# Patient Record
Sex: Male | Born: 1940 | Race: White | Hispanic: No | Marital: Married | Smoking: Former smoker
Health system: Southern US, Community
[De-identification: ages and names within clinical notes are randomized; demographics above are authoritative.]

## PROBLEM LIST (undated history)

## (undated) DIAGNOSIS — M19042 Primary osteoarthritis, left hand: Secondary | ICD-10-CM

## (undated) DIAGNOSIS — M5030 Other cervical disc degeneration, unspecified cervical region: Secondary | ICD-10-CM

## (undated) HISTORY — DX: Other cervical disc degeneration, unspecified cervical region: M50.30

## (undated) HISTORY — DX: Primary osteoarthritis, left hand: M19.042

---

## 2020-04-28 ENCOUNTER — Other Ambulatory Visit: Payer: Self-pay | Admitting: Unknown Physician Specialty

## 2020-04-28 DIAGNOSIS — H9122 Sudden idiopathic hearing loss, left ear: Secondary | ICD-10-CM

## 2020-05-08 ENCOUNTER — Ambulatory Visit: Payer: Medicare HMO

## 2020-05-23 LAB — COLOGUARD: COLOGUARD: NEGATIVE

## 2020-05-26 ENCOUNTER — Ambulatory Visit: Admission: RE | Admit: 2020-05-26 | Payer: Medicare HMO | Source: Ambulatory Visit

## 2020-05-30 ENCOUNTER — Ambulatory Visit
Admission: RE | Admit: 2020-05-30 | Discharge: 2020-05-30 | Disposition: A | Discharge status: Home / Self Care | Payer: Medicare HMO | Source: Ambulatory Visit | Attending: Unknown Physician Specialty | Admitting: Unknown Physician Specialty

## 2020-05-30 ENCOUNTER — Other Ambulatory Visit: Payer: Self-pay

## 2020-05-30 DIAGNOSIS — H9122 Sudden idiopathic hearing loss, left ear: Secondary | ICD-10-CM | POA: Diagnosis present

## 2020-05-30 MED ORDER — GADOBUTROL 1 MMOL/ML IV SOLN
7.5000 mL | Freq: Once | INTRAVENOUS | Status: AC | PRN
  Administered 2020-05-30: 7.5 mL via INTRAVENOUS

## 2021-07-23 ENCOUNTER — Other Ambulatory Visit
Admission: RE | Admit: 2021-07-23 | Discharge: 2021-07-23 | Disposition: A | Discharge status: Home / Self Care | Payer: Medicare HMO | Source: Ambulatory Visit | Attending: Specialist | Admitting: Specialist

## 2021-07-23 DIAGNOSIS — R0602 Shortness of breath: Secondary | ICD-10-CM | POA: Insufficient documentation

## 2021-07-23 LAB — D-DIMER, QUANTITATIVE: D-Dimer, Quant: 3.25 ug/mL-FEU — ABNORMAL HIGH (ref 0.00–0.50)

## 2021-07-24 ENCOUNTER — Other Ambulatory Visit: Payer: Self-pay | Admitting: Specialist

## 2021-07-24 ENCOUNTER — Ambulatory Visit
Admission: RE | Admit: 2021-07-24 | Discharge: 2021-07-24 | Disposition: A | Discharge status: Home / Self Care | Payer: Medicare HMO | Source: Ambulatory Visit | Attending: Specialist | Admitting: Specialist

## 2021-07-24 ENCOUNTER — Other Ambulatory Visit: Payer: Self-pay

## 2021-07-24 DIAGNOSIS — R0602 Shortness of breath: Secondary | ICD-10-CM | POA: Diagnosis present

## 2021-07-24 DIAGNOSIS — R7989 Other specified abnormal findings of blood chemistry: Secondary | ICD-10-CM

## 2021-07-24 LAB — POCT I-STAT CREATININE: Creatinine, Ser: 1.2 mg/dL (ref 0.61–1.24)

## 2021-07-24 IMAGING — CT CT ANGIO CHEST
2 of 7 series · 18 of 46 positions shown · IV contrast (APPLIED)
Comparison: Chest radiograph dated [DATE]

CLINICAL DATA: Shortness of breath for 2 weeks.

EXAM:
CT ANGIOGRAPHY CHEST WITH CONTRAST
TECHNIQUE: Multidetector CT imaging of the chest was performed using the
standard protocol during bolus administration of intravenous
contrast. Multiplanar CT image reconstructions and MIPs were
obtained to evaluate the vascular anatomy.

[Series 5: thins · axial · 0.76mm/px · z∈[+105,+352]mm · 15 of 344 slices shown]
[im 18/344  lung]
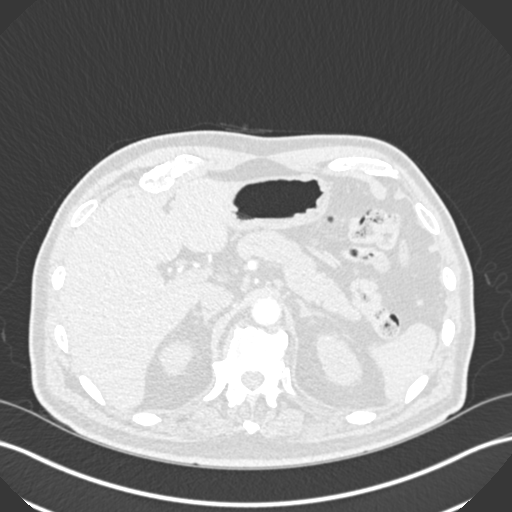
[im 35/344  soft-tissue]
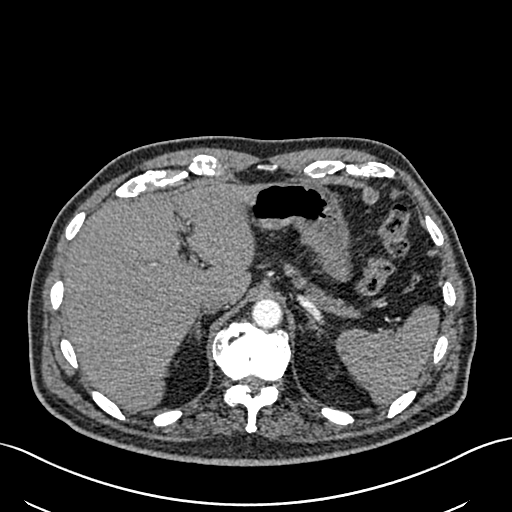
[im 69/344  lung]
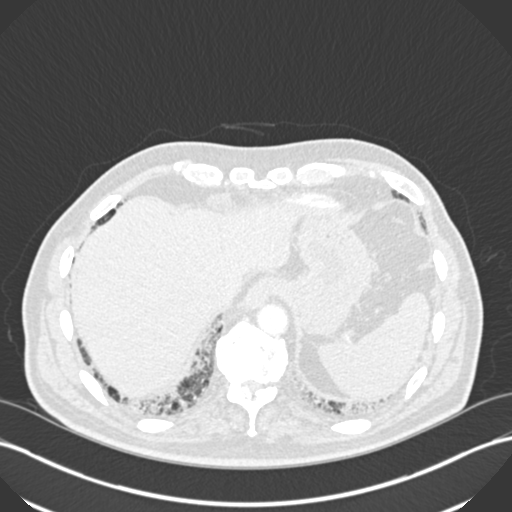
[im 86/344  soft-tissue]
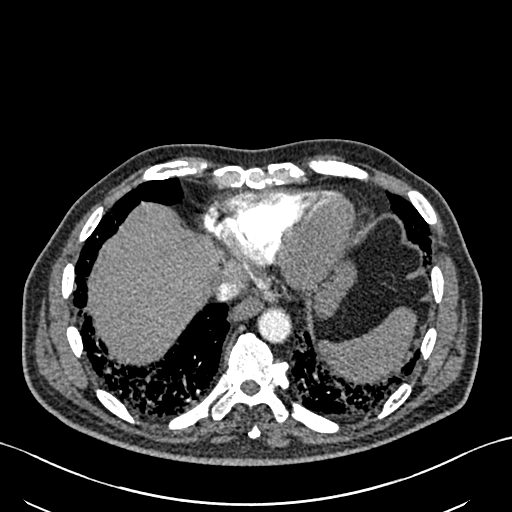
[im 103/344  lung]
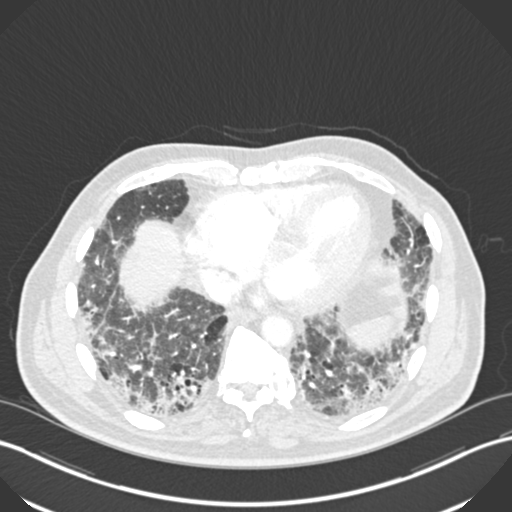
[im 121/344  soft-tissue]
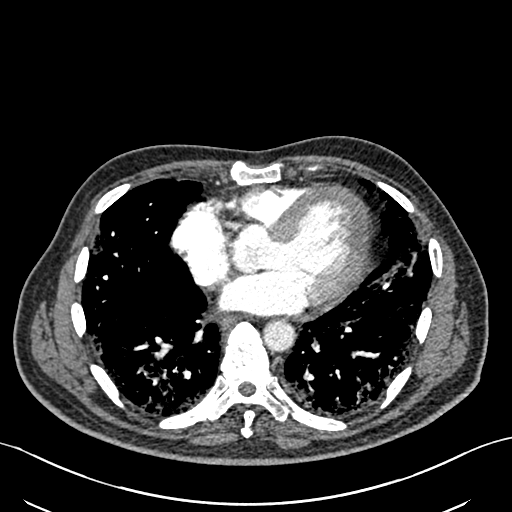
[im 155/344  lung]
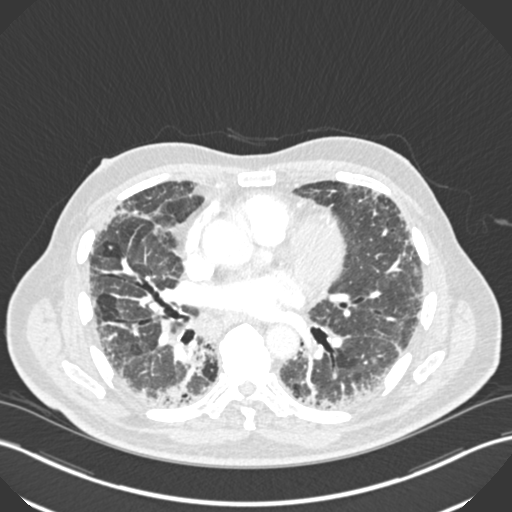
[im 172/344  soft-tissue]
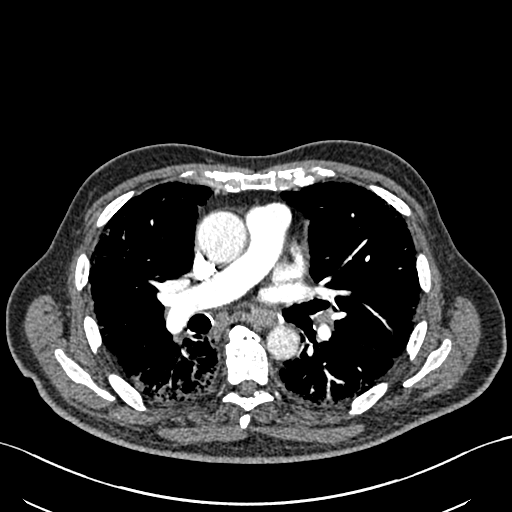
[im 189/344  lung]
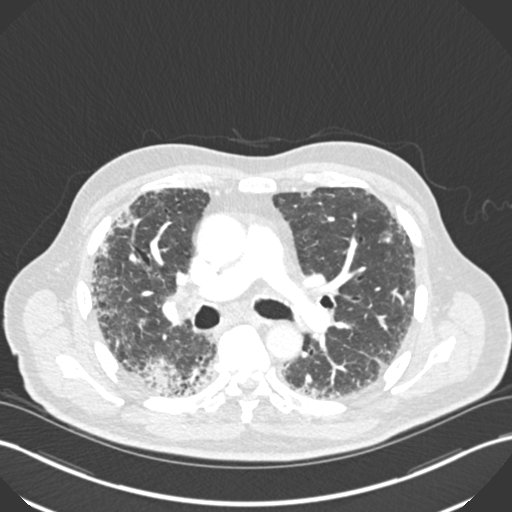
[im 223/344  soft-tissue]
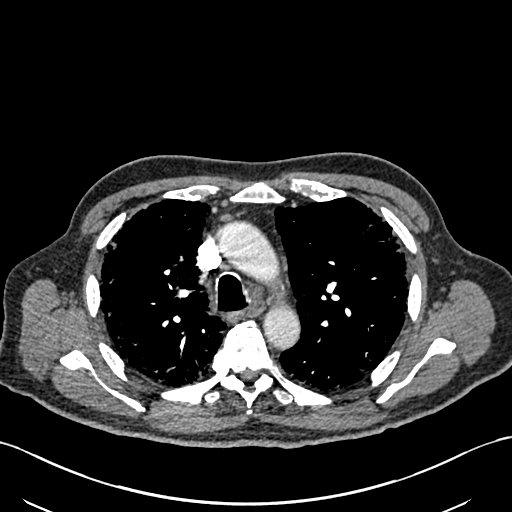
[im 241/344  lung]
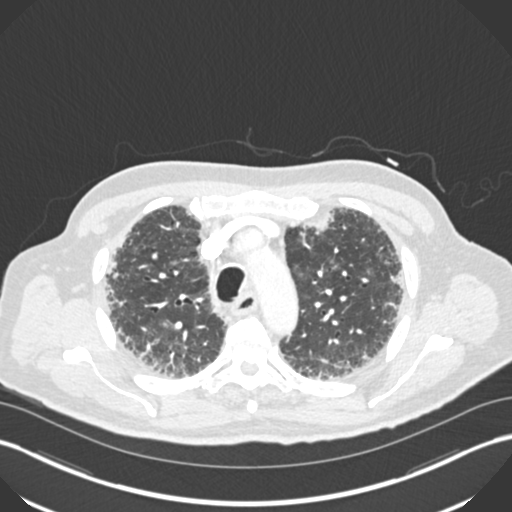
[im 258/344  soft-tissue]
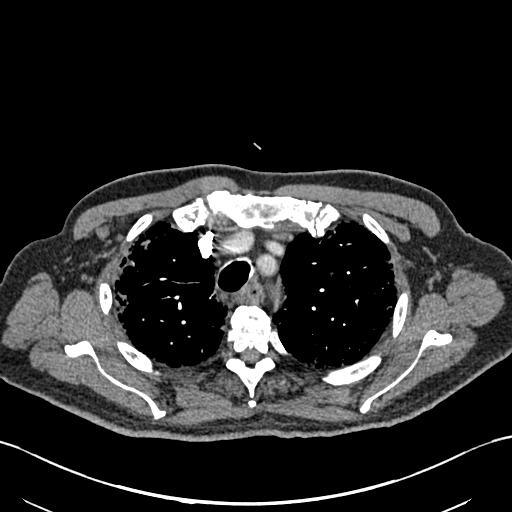
[im 275/344  lung]
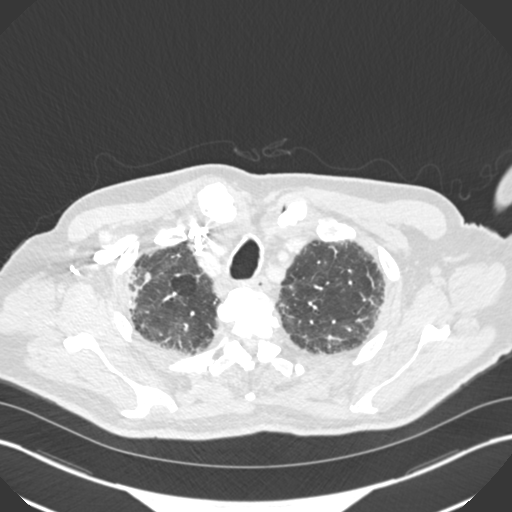
[im 309/344  soft-tissue]
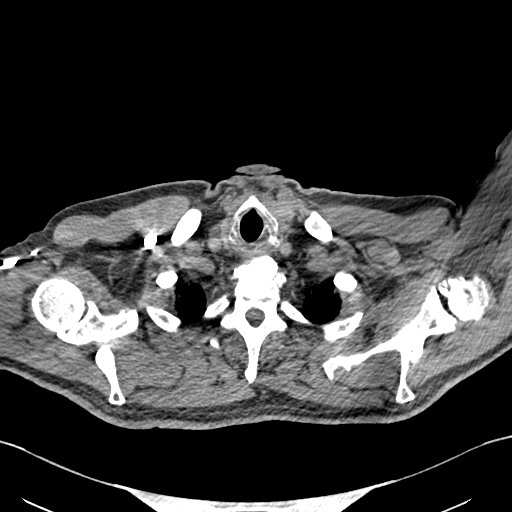
[im 326/344  lung]
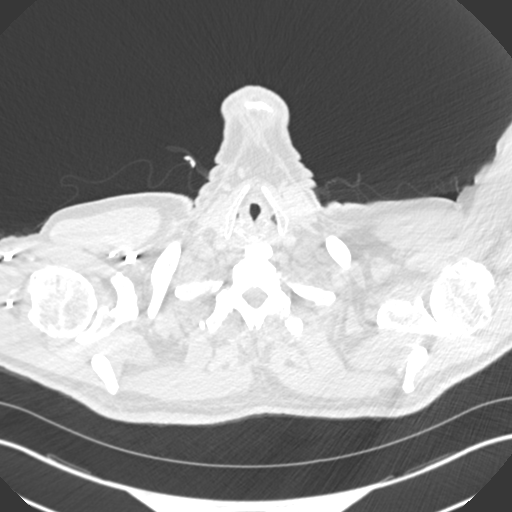

[Series 7: coronal mpr · coronal · 0.61mm/px · 3 of 86 slices shown]
[im 22/86  soft-tissue]
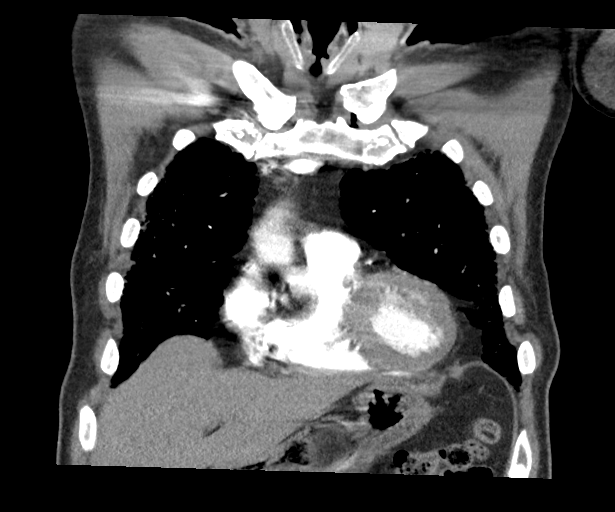
[im 43/86  soft-tissue]
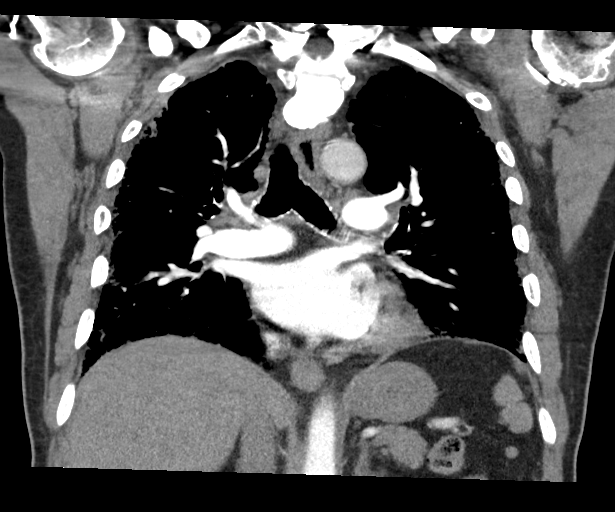
[im 64/86  soft-tissue]
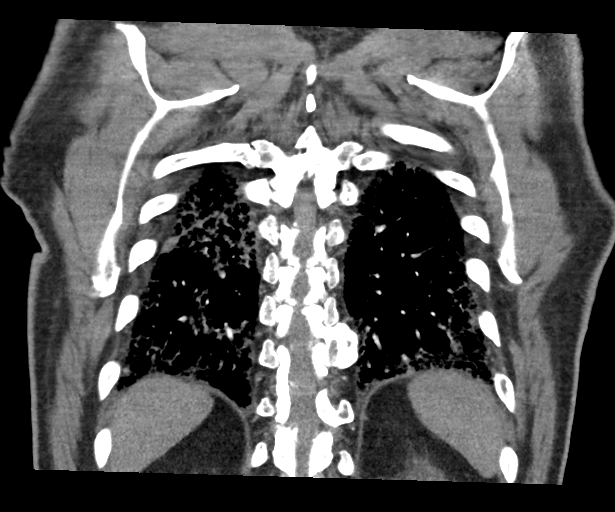

[18 of 46 positions shown; findings below may reference images not displayed]

RADIATION DOSE REDUCTION: This exam was performed according to the
departmental dose-optimization program which includes automated
exposure control, adjustment of the mA and/or kV according to
patient size and/or use of iterative reconstruction technique.

CONTRAST:  75mL OMNIPAQUE IOHEXOL 350 MG/ML SOLN
FINDINGS: Cardiovascular: Satisfactory opacification of the pulmonary arteries
to the segmental level. No evidence of pulmonary embolism. Normal
heart size. No pericardial effusion. Scattered coronary artery
atherosclerotic calcifications.

Mediastinum/Nodes: No enlarged mediastinal, hilar, or axillary lymph
nodes. Thyroid gland, trachea, and esophagus demonstrate no
significant findings.

Lungs/Pleura: There is extensive bilateral peripheral interlobular
septal thickening with mild traction bronchiectasis. There are small
cystic changes in bilateral lower lobes. There is mosaic attenuation
of the lung parenchyma concerning for air trapping and multiple
areas of ground-glass opacities. There is biapical
pleural/parenchymal scarring. No pleural effusion or pneumothorax.
No definite evidence of acute pneumonia.

Upper Abdomen: No acute abnormality.

Musculoskeletal: Diffuse idiopathic skeletal hyperostosis. No acute
osseous abnormality.

Review of the MIP images confirms the above findings.
IMPRESSION: 1.  No evidence of pulmonary embolism.

2. Advanced bilateral peripheral and basilar predominant fibrocystic
changes with air trapping and ground-glass opacities and mild
traction bronchiectasis. The findings most consistent with NSIP.
Differential includes other interstitial lung diseases including
UIP. Clinical correlation is suggested.

3.  Idiopathic skeletal hyperostosis.

## 2021-07-24 MED ORDER — IOHEXOL 350 MG/ML SOLN
75.0000 mL | Freq: Once | INTRAVENOUS | Status: AC | PRN
  Administered 2021-07-24: 75 mL via INTRAVENOUS

## 2021-12-03 ENCOUNTER — Encounter: Payer: Medicare HMO | Attending: Specialist

## 2021-12-03 DIAGNOSIS — J8410 Pulmonary fibrosis, unspecified: Secondary | ICD-10-CM

## 2021-12-03 NOTE — Progress Notes (Signed)
Virtual orientation call completed today. he has an appointment on Date: 12/16/21  for EP eval and gym Orientation.  Documentation of diagnosis can be found in [facility] Date: 10/28/21 .

## 2021-12-16 ENCOUNTER — Encounter: Payer: Medicare HMO | Attending: Specialist

## 2021-12-16 VITALS — Ht 64.0 in | Wt 185.2 lb

## 2021-12-16 DIAGNOSIS — J8410 Pulmonary fibrosis, unspecified: Secondary | ICD-10-CM | POA: Diagnosis not present

## 2021-12-16 NOTE — Patient Instructions (Signed)
Patient Instructions  Patient Details  Name: Sean Waters MRN: [id number] Date of Birth: 1941/04/13 Referring Provider:  Mertie Moores, MD  Below are your personal goals for exercise, nutrition, and risk factors. Our goal is to help you stay on track towards obtaining and maintaining these goals. We will be discussing your progress on these goals with you throughout the program.  Initial Exercise Prescription:  Initial Exercise Prescription - 12/16/21 1700       Date of Initial Exercise RX and Referring Provider   Date 12/16/21    Referring Provider Ned Clines MD      Oxygen   Maintain Oxygen Saturation 88% or higher      Treadmill   MPH 2    Grade 0.5    Minutes 15    METs 2.53      Recumbant Elliptical   Level 1    Watts 50    Minutes 15    METs 1.9      REL-XR   Level 1    Speed 50    Minutes 15    METs 1.9      Prescription Details   Frequency (times per week) 2    Duration Progress to 30 minutes of continuous aerobic without signs/symptoms of physical distress      Intensity   THRR 40-80% of Max Heartrate 95 - 124    Ratings of Perceived Exertion 11-13    Perceived Dyspnea 0-4      Progression   Progression Continue to progress workloads to maintain intensity without signs/symptoms of physical distress.      Resistance Training   Training Prescription Yes    Weight 4 lb    Reps 10-15             Exercise Goals: Frequency: Be able to perform aerobic exercise two to three times per week in program working toward 2-5 days per week of home exercise.  Intensity: Work with a perceived exertion of 11 (fairly light) - 15 (hard) while following your exercise prescription.  We will make changes to your prescription with you as you progress through the program.   Duration: Be able to do 30 to 45 minutes of continuous aerobic exercise in addition to a 5 minute warm-up and a 5 minute cool-down routine.   Nutrition Goals: Your personal  nutrition goals will be established when you do your nutrition analysis with the dietician.  The following are general nutrition guidelines to follow: Cholesterol < 200mg /day Sodium < 1500mg /day Fiber: Men over 50 yrs - 30 grams per day  Personal Goals:  Personal Goals and Risk Factors at Admission - 12/16/21 1722       Core Components/Risk Factors/Patient Goals on Admission    Weight Management Yes;Weight Loss    Intervention Weight Management: Develop a combined nutrition and exercise program designed to reach desired caloric intake, while maintaining appropriate intake of nutrient and fiber, sodium and fats, and appropriate energy expenditure required for the weight goal.;Weight Management: Provide education and appropriate resources to help participant work on and attain dietary goals.;Weight Management/Obesity: Establish reasonable short term and long term weight goals.    Admit Weight 185 lb (83.9 kg)    Goal Weight: Short Term 180 lb (81.6 kg)    Goal Weight: Long Term 175 lb (79.4 kg)    Expected Outcomes Short Term: Continue to assess and modify interventions until short term weight is achieved;Long Term: Adherence to nutrition and physical activity/exercise program aimed toward attainment of  established weight goal;Understanding recommendations for meals to include 15-35% energy as protein, 25-35% energy from fat, 35-60% energy from carbohydrates, less than 200mg  of dietary cholesterol, 20-35 gm of total fiber daily;Understanding of distribution of calorie intake throughout the day with the consumption of 4-5 meals/snacks;Weight Loss: Understanding of general recommendations for a balanced deficit meal plan, which promotes 1-2 lb weight loss per week and includes a negative energy balance of [phone] kcal/d    Improve shortness of breath with ADL's Yes    Intervention Provide education, individualized exercise plan and daily activity instruction to help decrease symptoms of SOB with  activities of daily living.    Expected Outcomes Short Term: Improve cardiorespiratory fitness to achieve a reduction of symptoms when performing ADLs;Long Term: Be able to perform more ADLs without symptoms or delay the onset of symptoms    Increase knowledge of respiratory medications and ability to use respiratory devices properly  Yes    Intervention Provide education and demonstration as needed of appropriate use of medications, inhalers, and oxygen therapy.    Expected Outcomes Short Term: Achieves understanding of medications use. Understands that oxygen is a medication prescribed by physician. Demonstrates appropriate use of inhaler and oxygen therapy.;Long Term: Maintain appropriate use of medications, inhalers, and oxygen therapy.    Hypertension Yes    Intervention Provide education on lifestyle modifcations including regular physical activity/exercise, weight management, moderate sodium restriction and increased consumption of fresh fruit, vegetables, and low fat dairy, alcohol moderation, and smoking cessation.;Monitor prescription use compliance.    Expected Outcomes Short Term: Continued assessment and intervention until BP is < 140/4mm HG in hypertensive participants. < 130/86mm HG in hypertensive participants with diabetes, heart failure or chronic kidney disease.;Long Term: Maintenance of blood pressure at goal levels.    Lipids Yes    Intervention Provide education and support for participant on nutrition & aerobic/resistive exercise along with prescribed medications to achieve LDL 70mg , HDL >40mg .    Expected Outcomes Short Term: Participant states understanding of desired cholesterol values and is compliant with medications prescribed. Participant is following exercise prescription and nutrition guidelines.;Long Term: Cholesterol controlled with medications as prescribed, with individualized exercise RX and with personalized nutrition plan. Value goals: LDL < 70mg , HDL > 40 mg.              Tobacco Use Initial Evaluation: Social History   Tobacco Use  Smoking Status Former   Packs/day: 1.00   Types: Cigarettes   Quit date: 03/17/1953   Years since quitting: 68.7  Smokeless Tobacco Never    Exercise Goals and Review:  Exercise Goals     Row Name 12/16/21 1720             Exercise Goals   Increase Physical Activity Yes       Intervention Provide advice, education, support and counseling about physical activity/exercise needs.;Develop an individualized exercise prescription for aerobic and resistive training based on initial evaluation findings, risk stratification, comorbidities and participant's personal goals.       Expected Outcomes Short Term: Attend rehab on a regular basis to increase amount of physical activity.;Long Term: Add in home exercise to make exercise part of routine and to increase amount of physical activity.;Long Term: Exercising regularly at least 3-5 days a week.       Increase Strength and Stamina Yes       Intervention Provide advice, education, support and counseling about physical activity/exercise needs.;Develop an individualized exercise prescription for aerobic and resistive training based on initial  evaluation findings, risk stratification, comorbidities and participant's personal goals.       Expected Outcomes Short Term: Increase workloads from initial exercise prescription for resistance, speed, and METs.;Short Term: Perform resistance training exercises routinely during rehab and add in resistance training at home;Long Term: Improve cardiorespiratory fitness, muscular endurance and strength as measured by increased METs and functional capacity ( )       Able to understand and use rate of perceived exertion (RPE) scale Yes       Intervention Provide education and explanation on how to use RPE scale       Expected Outcomes Short Term: Able to use RPE daily in rehab to express subjective intensity level;Long Term:  Able to use  RPE to guide intensity level when exercising independently       Able to understand and use Dyspnea scale Yes       Intervention Provide education and explanation on how to use Dyspnea scale       Expected Outcomes Short Term: Able to use Dyspnea scale daily in rehab to express subjective sense of shortness of breath during exertion;Long Term: Able to use Dyspnea scale to guide intensity level when exercising independently       Knowledge and understanding of Target Heart Rate Range (THRR) Yes       Intervention Provide education and explanation of THRR including how the numbers were predicted and where they are located for reference       Expected Outcomes Short Term: Able to state/look up THRR;Long Term: Able to use THRR to govern intensity when exercising independently;Short Term: Able to use daily as guideline for intensity in rehab       Able to check pulse independently Yes       Intervention Review the importance of being able to check your own pulse for safety during independent exercise;Provide education and demonstration on how to check pulse in carotid and radial arteries.       Expected Outcomes Short Term: Able to explain why pulse checking is important during independent exercise;Long Term: Able to check pulse independently and accurately       Understanding of Exercise Prescription Yes       Intervention Provide education, explanation, and written materials on patient's individual exercise prescription       Expected Outcomes Short Term: Able to explain program exercise prescription;Long Term: Able to explain home exercise prescription to exercise independently                Copy of goals given to participant.

## 2021-12-16 NOTE — Progress Notes (Signed)
Pulmonary Individual Treatment Plan  Patient Details  Name: Sean Waters MRN: [id number] Date of Birth: 22-May-1940 Referring Provider:   Flowsheet Row [facility]  Referring Provider Ned ClinesFleming, Herbon MD       Initial Encounter Date:  Flowsheet Row [facility]  Date 12/16/21       Visit Diagnosis: Pulmonary fibrosis (HCC)  Patient's Home Medications on Admission:  Current Outpatient Medications:    albuterol (VENTOLIN HFA) 108 (90 Base) MCG/ACT inhaler, Inhale 2 puffs into the lungs every 6 (six) hours as needed., Disp: , Rfl:    ALPRAZolam (XANAX) 0.25 MG tablet, Take 0.25 mg by mouth as needed for anxiety., Disp: , Rfl:    enalapril (VASOTEC) 10 MG tablet, Take 1.5 tablets by mouth daily., Disp: , Rfl:    ezetimibe-simvastatin (VYTORIN) 10-10 MG tablet, Take 1 tablet by mouth at bedtime., Disp: , Rfl:    levothyroxine (SYNTHROID) 50 MCG tablet, Take 50 mcg by mouth daily., Disp: , Rfl:    predniSONE (DELTASONE) 10 MG tablet, Take 1 tablet by mouth daily., Disp: , Rfl:    predniSONE (DELTASONE) 5 MG tablet, Take by mouth., Disp: , Rfl:    tadalafil (CIALIS) 5 MG tablet, Take by mouth., Disp: , Rfl:   Past Medical History: No past medical history on file.  Tobacco Use: Social History   Tobacco Use  Smoking Status Former   Packs/day: 1.00   Types: Cigarettes   Quit date: 03/17/1953   Years since quitting: 68.7  Smokeless Tobacco Never    Labs: Review Flowsheet        No data to display           Pulmonary Assessment Scores:  Pulmonary Assessment Scores     Row Name 12/16/21 1707         ADL UCSD   ADL Phase Entry     SOB Score total 17     Rest 0     Walk 1     Stairs 1     Bath 0     Dress 0     Shop 1       CAT Score   CAT Score 4       mMRC Score   mMRC Score 1              UCSD: Self-administered rating of dyspnea  associated with activities of daily living (ADLs) 6-point scale (0 = "not at all" to 5 = "maximal or unable to do because of breathlessness")  Scoring Scores range from 0 to 120.  Minimally important difference is 5 units  CAT: CAT can identify the health impairment of COPD patients and is better correlated with disease progression.  CAT has a scoring range of zero to 40. The CAT score is classified into four groups of low (less than 10), medium (10 - 20), high (21-30) and very high (31-40) based on the impact level of disease on health status. A CAT score over 10 suggests significant symptoms.  A worsening CAT score could be explained by an exacerbation, poor medication adherence, poor inhaler technique, or progression of COPD or comorbid conditions.  CAT MCID is 2 points  mMRC: mMRC (Modified Medical Research Council) Dyspnea Scale is used to assess the degree of baseline functional disability in patients of respiratory disease due to dyspnea. No minimal important difference is established. A decrease in score of 1 point  or greater is considered a positive change.   Pulmonary Function Assessment:   Exercise Target Goals: Exercise Program Goal: Individual exercise prescription set using results from initial 6 min walk test and THRR while considering  patient's activity barriers and safety.   Exercise Prescription Goal: Initial exercise prescription builds to 30-45 minutes a day of aerobic activity, 2-3 days per week.  Home exercise guidelines will be given to patient during program as part of exercise prescription that the participant will acknowledge.  Education: Aerobic Exercise: - Group verbal and visual presentation on the components of exercise prescription. Introduces F.I.T.T principle from ACSM for exercise prescriptions.  Reviews F.I.T.T. principles of aerobic exercise including progression. Written material given at graduation. Flowsheet Row [facility]  Education need identified 12/16/21       Education: Resistance Exercise: - Group verbal and visual presentation on the components of exercise prescription. Introduces F.I.T.T principle from ACSM for exercise prescriptions  Reviews F.I.T.T. principles of resistance exercise including progression. Written material given at graduation.    Education: Exercise & Equipment Safety: - Individual verbal instruction and demonstration of equipment use and safety with use of the equipment. Flow[facility]  Education need identified 12/16/21  Date 12/16/21  Educator KL  Instruction Review Code 1- Verbalizes Understanding       Education: Exercise Physiology & General Exercise Guidelines: - Group verbal and written instruction with models to review the exercise physiology of the cardiovascular system and associated critical values. Provides general exercise guidelines with specific guidelines to those with heart or lung disease.    Education: Flexibility, Balance, Mind/Body Relaxation: - Group verbal and visual presentation with interactive activity on the components of exercise prescription. Introduces F.I.T.T principle from ACSM for exercise prescriptions. Reviews F.I.T.T. principles of flexibility and balance exercise training including progression. Also discusses the mind body connection.  Reviews various relaxation techniques to help reduce and manage stress (i.e. Deep breathing, progressive muscle relaxation, and visualization). Balance handout provided to take home. Written material given at graduation.   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 HR 105 bpm     Max Ex. BP 152/72     2 Minute Post BP 132/76       Interval HR   1 Minute HR 79     2 Minute HR 94  3 Minute HR 105     4 Minute HR 105     5 Minute HR 100     6 Minute HR 105     2 Minute Post HR 70     Interval Heart Rate? Yes       Interval Oxygen   Interval Oxygen? Yes     Baseline Oxygen Saturation % 95 %     1 Minute Oxygen Saturation % 92 %     1 Minute Liters of Oxygen 0 L  RA     2 Minute Oxygen Saturation % 90 %     2 Minute Liters of Oxygen 0 L     3 Minute Oxygen Saturation % 88 %     3 Minute Liters of Oxygen 0 L     4 Minute Oxygen Saturation % 89 %     4 Minute Liters of Oxygen 0 L     5 Minute Oxygen Saturation % 88 %     5 Minute Liters of Oxygen 0 L     6 Minute Oxygen Saturation % 87 %     6 Minute Liters of Oxygen 0 L     2 Minute Post Oxygen Saturation % 96 %     2 Minute Post Liters of Oxygen 0 L             Oxygen Initial Assessment:  Oxygen Initial Assessment - 12/16/21 1707       Home Oxygen   Home Oxygen Device None    Sleep Oxygen Prescription CPAP    Liters per minute 5    Home Exercise Oxygen Prescription None    Home Resting Oxygen Prescription None    Compliance with Home Oxygen Use No      Initial 6 min Walk   Oxygen Used None      Program Oxygen Prescription   Program Oxygen Prescription None      Intervention   Short Term Goals To learn and understand importance of monitoring SPO2 with pulse oximeter and demonstrate accurate use of the pulse oximeter.;To learn and understand importance of maintaining oxygen saturations>88%;To  learn and demonstrate proper pursed lip breathing techniques or other breathing techniques. ;To learn and demonstrate proper use of respiratory medications    Long  Term Goals Compliance with respiratory medication;Exhibits proper breathing techniques, such as pursed lip breathing or other method taught during program session;Maintenance of O2 saturations>88%;Verbalizes importance of monitoring SPO2 with pulse oximeter and return demonstration             Oxygen Re-Evaluation:   Oxygen Discharge (Final Oxygen Re-Evaluation):   Initial Exercise Prescription:  Initial Exercise Prescription - 12/16/21 1700       Date of Initial Exercise RX and Referring Provider   Date 12/16/21    Referring Provider Ned Clines MD      Oxygen   Maintain Oxygen Saturation 88% or higher      Treadmill   MPH 2    Grade 0.5    Minutes 15    METs 2.53      Recumbant Elliptical   Level 1    Watts 50    Minutes 15    METs 1.9      REL-XR   Level 1    Speed 50    Minutes 15    METs 1.9      Prescription Details   Frequency (times per week) 2    Duration Progress to 30 minutes of  continuous aerobic without signs/symptoms of physical distress      Intensity   THRR 40-80% of Max Heartrate 95 - 124    Ratings of Perceived Exertion 11-13    Perceived Dyspnea 0-4      Progression   Progression Continue to progress workloads to maintain intensity without signs/symptoms of physical distress.      Resistance Training   Training Prescription Yes    Weight 4 lb    Reps 10-15             Perform Capillary Blood Glucose checks as needed.  Exercise Prescription Changes:   Exercise Prescription Changes     Row Name 12/16/21 1700             Response to Exercise   Blood Pressure (Admit) 140/74       Blood Pressure (Exercise) 166/74       Blood Pressure (Exit) 132/76       Heart Rate (Admit) 66 bpm       Heart Rate (Exercise) 105 bpm       Heart Rate (Exit) 70 bpm        Oxygen Saturation (Admit) 95 %       Oxygen Saturation (Exercise) 88 %       Oxygen Saturation (Exit) 96 %       Rating of Perceived Exertion (Exercise) 11       Perceived Dyspnea (Exercise) 0       Symptoms none       Comments results                Exercise Comments:   Exercise Goals and Review:   Exercise Goals     Row Name 12/16/21 1720             Exercise Goals   Increase Physical Activity Yes       Intervention Provide advice, education, support and counseling about physical activity/exercise needs.;Develop an individualized exercise prescription for aerobic and resistive training based on initial evaluation findings, risk stratification, comorbidities and participant's personal goals.       Expected Outcomes Short Term: Attend rehab on a regular basis to increase amount of physical activity.;Long Term: Add in home exercise to make exercise part of routine and to increase amount of physical activity.;Long Term: Exercising regularly at least 3-5 days a week.       Increase Strength and Stamina Yes       Intervention Provide advice, education, support and counseling about physical activity/exercise needs.;Develop an individualized exercise prescription for aerobic and resistive training based on initial evaluation findings, risk stratification, comorbidities and participant's personal goals.       Expected Outcomes Short Term: Increase workloads from initial exercise prescription for resistance, speed, and METs.;Short Term: Perform resistance training exercises routinely during rehab and add in resistance training at home;Long Term: Improve cardiorespiratory fitness, muscular endurance and strength as measured by increased METs and functional capacity ( )       Able to understand and use rate of perceived exertion (RPE) scale Yes       Intervention Provide education and explanation on how to use RPE scale       Expected Outcomes Short Term: Able to use RPE daily in  rehab to express subjective intensity level;Long Term:  Able to use RPE to guide intensity level when exercising independently       Able to understand and use Dyspnea scale Yes       Intervention  Provide education and explanation on how to use Dyspnea scale       Expected Outcomes Short Term: Able to use Dyspnea scale daily in rehab to express subjective sense of shortness of breath during exertion;Long Term: Able to use Dyspnea scale to guide intensity level when exercising independently       Knowledge and understanding of Target Heart Rate Range (THRR) Yes       Intervention Provide education and explanation of THRR including how the numbers were predicted and where they are located for reference       Expected Outcomes Short Term: Able to state/look up THRR;Long Term: Able to use THRR to govern intensity when exercising independently;Short Term: Able to use daily as guideline for intensity in rehab       Able to check pulse independently Yes       Intervention Review the importance of being able to check your own pulse for safety during independent exercise;Provide education and demonstration on how to check pulse in carotid and radial arteries.       Expected Outcomes Short Term: Able to explain why pulse checking is important during independent exercise;Long Term: Able to check pulse independently and accurately       Understanding of Exercise Prescription Yes       Intervention Provide education, explanation, and written materials on patient's individual exercise prescription       Expected Outcomes Short Term: Able to explain program exercise prescription;Long Term: Able to explain home exercise prescription to exercise independently                Exercise Goals Re-Evaluation :   Discharge Exercise Prescription (Final Exercise Prescription Changes):  Exercise Prescription Changes - 12/16/21 1700       Response to Exercise   Blood Pressure (Admit) 140/74    Blood Pressure  (Exercise) 166/74    Blood Pressure (Exit) 132/76    Heart Rate (Admit) 66 bpm    Heart Rate (Exercise) 105 bpm    Heart Rate (Exit) 70 bpm    Oxygen Saturation (Admit) 95 %    Oxygen Saturation (Exercise) 88 %    Oxygen Saturation (Exit) 96 %    Rating of Perceived Exertion (Exercise) 11    Perceived Dyspnea (Exercise) 0    Symptoms none    Comments results             Nutrition:  Target Goals: Understanding of nutrition guidelines, daily intake of sodium 1500mg , cholesterol 200mg , calories 30% from fat and 7% or less from saturated fats, daily to have 5 or more servings of fruits and vegetables.  Education: All About Nutrition: -Group instruction provided by verbal, written material, interactive activities, discussions, models, and posters to present general guidelines for heart healthy nutrition including fat, fiber, MyPlate, the role of sodium in heart healthy nutrition, utilization of the nutrition label, and utilization of this knowledge for meal planning. Follow up email sent as well. Written material given at graduation.   Biometrics:  Pre Biometrics - 12/16/21 1708       Pre Biometrics   Height 5\' 4"  (1.626 m)    Weight 185 lb 3.2 oz (84 kg)    BMI (Calculated) 31.77    Single Leg Stand 24.3 seconds              Nutrition Therapy Plan and Nutrition Goals:  Nutrition Therapy & Goals - 12/16/21 1721       Intervention Plan   Intervention Prescribe,  educate and counsel regarding individualized specific dietary modifications aiming towards targeted core components such as weight, hypertension, lipid management, diabetes, heart failure and other comorbidities.    Expected Outcomes Short Term Goal: Understand basic principles of dietary content, such as calories, fat, sodium, cholesterol and nutrients.;Short Term Goal: A plan has been developed with personal nutrition goals set during dietitian appointment.;Long Term Goal: Adherence to prescribed nutrition  plan.             Nutrition Assessments:  MEDIFICTS Score Key: ?70 Need to make dietary changes  40-70 Heart Healthy Diet ? 40 Therapeutic Level Cholesterol Diet  Flowsheet Row Pulmonary Rehab [facility] 12/16/2021 in [facility] Cardiac and Pulmonary Rehab  Picture Your Plate Total Score on Admission 68      Picture Your Plate Scores: <16 Unhealthy dietary pattern with much room for improvement. 41-50 Dietary pattern unlikely to meet recommendations for good health and room for improvement. 51-60 More healthful dietary pattern, with some room for improvement.  >60 Healthy dietary pattern, although there may be some specific behaviors that could be improved.   Nutrition Goals Re-Evaluation:   Nutrition Goals Discharge (Final Nutrition Goals Re-Evaluation):   Psychosocial: Target Goals: Acknowledge presence or absence of significant depression and/or stress, maximize coping skills, provide positive support system. Participant is able to verbalize types and ability to use techniques and skills needed for reducing stress and depression.   Education: Stress, Anxiety, and Depression - Group verbal and visual presentation to define topics covered.  Reviews how body is impacted by stress, anxiety, and depression.  Also discusses healthy ways to reduce stress and to treat/manage anxiety and depression.  Written material given at graduation.   Education: Sleep Hygiene -Provides group verbal and written instruction about how sleep can affect your health.  Define sleep hygiene, discuss sleep cycles and impact of sleep habits. Review good sleep hygiene tips.    Initial Review & Psychosocial Screening:  Initial Psych Review & Screening - 12/03/21 1329       Initial Review   Current issues with Current Stress Concerns    Source of Stress Concerns Chronic Illness    Comments He reports feeling stress with his diagnosis. He has called a patient advocate and told them what Dr. Meredeth Ide said about  his prognosis and that he didn't feel like he was supported; he didn't have someone on staff to talk to after being given his prognosis. When he got home he was depressed, lost 15 lbs, and thought he was going to die. He was looking for guidance and didn't feel like he got it - he spoke to patient relations and did not get a response. He reports that he was diagnosed in March and he was the one to ask for a referall to rehab.      Family Dynamics   Good Support System? 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     Barriers   Psychosocial barriers to participate in program The patient should benefit from training in stress management and relaxation.      Screening Interventions   Interventions Encouraged to exercise;Provide feedback about the scores to participant;To provide support and resources with identified psychosocial needs    Expected Outcomes Short Term goal: Utilizing psychosocial counselor, staff and physician to assist with identification of specific Stressors or current issues interfering with healing process. Setting desired goal for each stressor or current issue identified.;Long  Term Goal: Stressors or current issues are controlled or eliminated.;Short Term goal: Identification and review with participant of any Quality of Life or Depression concerns found by scoring the questionnaire.;Long Term goal: The participant improves quality of Life and PHQ9 Scores as seen by post scores and/or verbalization of changes             Quality of Life Scores:  Scores of 19 and below usually indicate a poorer quality of life in these areas.  A difference of  2-3 points is a clinically meaningful difference.  A difference of 2-3 points in the total score of the Quality of Life Index has been associated with significant improvement in overall quality of life, self-image, physical symptoms, and general health in studies assessing  change in quality of life.  PHQ-9: Review Flowsheet       12/16/2021  Depression screen PHQ 2/9  Decreased Interest 0  Down, Depressed, Hopeless 0  PHQ - 2 Score 0  Altered sleeping 0  Tired, decreased energy 1  Change in appetite 0  Feeling bad or failure about yourself  0  Trouble concentrating 0  Moving slowly or fidgety/restless 0  Suicidal thoughts 0  PHQ-9 Score 1  Difficult doing work/chores Somewhat difficult   Interpretation of Total Score  Total Score Depression Severity:  1-4 = Minimal depression, 5-9 = Mild depression, 10-14 = Moderate depression, 15-19 = Moderately severe depression, 20-27 = Severe depression   Psychosocial Evaluation and Intervention:  Psychosocial Evaluation - 12/03/21 1433       Psychosocial Evaluation & Interventions   Interventions Stress management education;Relaxation education;Encouraged to exercise with the program and follow exercise prescription    Comments He reports feeling stress with his diagnosis. He has called a patient advocate and told them what Dr. Meredeth Ide said about his prognosis and that he didn't feel like he was supported; he didn't have someone on staff to talk to after being given his prognosis. When he got home he was depressed, lost 15 lbs, and thought he was going to die. He was looking for guidance and didn't feel like he got it - he spoke to patient relations and did not get a response. He reports that he was diagnosed in March and he was the one to ask for a referall to rehab. 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 He also has two children and 6 grandchildren in [place] who he visits. He has been retired and is still working on relaxing in the afternoon instead of being productive; in the mornings he exercises and plays the piano. For exercise he will use the stepper and recumbent bike at home: he alternates the  time from 1.5 hours and 30 minutes alternating days. He uses a pulse ox to monitor his HR and O2; he keeps his HR around 100 during exercise. He is excited to start Pulmonary Rehab.    Expected Outcomes ST: attend all scheduled exercise/education appointments, progress with exercise prescription while maintaining SpO2 <88%  LT: Improve shortness of breath with ADLs    Continue Psychosocial Services  Follow up required by staff             Psychosocial Re-Evaluation:   Psychosocial Discharge (Final Psychosocial Re-Evaluation):   Education: Education Goals: Education classes will be provided on a weekly basis, covering required topics. Participant will state understanding/return demonstration of  topics presented.  Learning Barriers/Preferences:   General Pulmonary Education Topics:  Infection Prevention: - Provides verbal and written material to individual with discussion of infection control including proper hand washing and proper equipment cleaning during exercise session. Flowsheet Row Pulmonary Rehab from 12/16/2021 in [facility] Cardiac and Pulmonary Rehab  Education need identified 12/16/21  Date 12/16/21  Educator KL  Instruction Review Code 1- Verbalizes Understanding       Falls Prevention: - Provides verbal and written material to individual with discussion of falls prevention and safety. Flowsheet Row Pulmonary Rehab from 12/03/2021 in [facility] Card[facility] and Pulmonary Rehab  Education need identified 12/03/21  Date 12/03/21  Educator [facility]  Instruction Review Code 1- Verbalizes Understanding       Chronic Lung Disease Review: - Group verbal instruction with posters, models, PowerPoint presentations and videos,  to review new updates, new respiratory medications, new advancements in procedures and treatments. Providing information on websites and "800" numbers for continued self-education. Includes information about supplement oxygen, available portable oxygen systems,  continuous and intermittent flow rates, oxygen safety, concentrators, and Medicare reimbursement for oxygen. Explanation of Pulmonary Drugs, including class, frequency, complications, importance of spacers, rinsing mouth after steroid MDI's, and proper cleaning methods for nebulizers. Review of basic lung anatomy and physiology related to function, structure, and complications of lung disease. Review of risk factors. Discussion about methods for diagnosing sleep apnea and types of masks and machines for OSA. Includes a review of the use of types of environmental controls: home humidity, furnaces, filters, dust mite/pet prevention, HEPA vacuums. Discussion about weather changes, air quality and the benefits of nasal washing. Instruction on Warning signs, infection symptoms, calling MD promptly, preventive modes, and value of vaccinations. Review of effective airway clearance, coughing and/or vibration techniques. Emphasizing that all should Create an Action Plan. Written material given at graduation. Flowsheet Row Pulmonary Rehab from 12/16/2021 in [facility] Cardiac and Pulmonary Rehab  Education need identified 12/16/21       AED/CPR: - Group verbal and written instruction with the use of models to demonstrate the basic use of the AED with the basic ABC's of resuscitation.    Anatomy and Cardiac Procedures: - Group verbal and visual presentation and models provide information about basic cardiac anatomy and function. Reviews the testing methods done to diagnose heart disease and the outcomes of the test results. Describes the treatment choices: Medical Management, Angioplasty, or Coronary Bypass Surgery for treating various heart conditions including Myocardial Infarction, Angina, Valve Disease, and Cardiac Arrhythmias.  Written material given at graduation.   Medication Safety: - Group verbal and visual instruction to review commonly prescribed medications for heart and lung disease. Reviews the  medication, class of the drug, and side effects. Includes the steps to properly store meds and maintain the prescription regimen.  Written material given at graduation.   Other: -Provides group and verbal instruction on various topics (see comments)   Knowledge Questionnaire Score:  Knowledge Questionnaire Score - 12/16/21 1706       Knowledge Questionnaire Score   Pre Score 13/18              Core Components/Risk Factors/Patient Goals at Admission:  Personal Goals and Risk Factors at Admission - 12/16/21 1722       Core Components/Risk Factors/Patient Goals on Admission    Weight Management Yes;Weight Loss    Intervention Weight Management: Develop a combined nutrition and exercise program designed to reach desired caloric intake, while maintaining appropriate intake of nutrient and fiber, sodium and  fats, and appropriate energy expenditure required for the weight goal.;Weight Management: Provide education and appropriate resources to help participant work on and attain dietary goals.;Weight Management/Obesity: Establish reasonable short term and long term weight goals.    Admit Weight 185 lb (83.9 kg)    Goal Weight: Short Term 180 lb (81.6 kg)    Goal Weight: Long Term 175 lb (79.4 kg)    Expected Outcomes Short Term: Continue to assess and modify interventions until short term weight is achieved;Long Term: Adherence to nutrition and physical activity/exercise program aimed toward attainment of established weight goal;Understanding recommendations for meals to include 15-35% energy as protein, 25-35% energy from fat, 35-60% energy from carbohydrates, less than 200mg  of dietary cholesterol, 20-35 gm of total fiber daily;Understanding of distribution of calorie intake throughout the day with the consumption of 4-5 meals/snacks;Weight Loss: Understanding of general recommendations for a balanced deficit meal plan, which promotes 1-2 lb weight loss per week and includes a negative energy  balance of [phone] kcal/d    Improve shortness of breath with ADL's Yes    Intervention Provide education, individualized exercise plan and daily activity instruction to help decrease symptoms of SOB with activities of daily living.    Expected Outcomes Short Term: Improve cardiorespiratory fitness to achieve a reduction of symptoms when performing ADLs;Long Term: Be able to perform more ADLs without symptoms or delay the onset of symptoms    Increase knowledge of respiratory medications and ability to use respiratory devices properly  Yes    Intervention Provide education and demonstration as needed of appropriate use of medications, inhalers, and oxygen therapy.    Expected Outcomes Short Term: Achieves understanding of medications use. Understands that oxygen is a medication prescribed by physician. Demonstrates appropriate use of inhaler and oxygen therapy.;Long Term: Maintain appropriate use of medications, inhalers, and oxygen therapy.    Hypertension Yes    Intervention Provide education on lifestyle modifcations including regular physical activity/exercise, weight management, moderate sodium restriction and increased consumption of fresh fruit, vegetables, and low fat dairy, alcohol moderation, and smoking cessation.;Monitor prescription use compliance.    Expected Outcomes Short Term: Continued assessment and intervention until BP is < 140/60mm HG in hypertensive participants. < 130/39mm HG in hypertensive participants with diabetes, heart failure or chronic kidney disease.;Long Term: Maintenance of blood pressure at goal levels.    Lipids Yes    Intervention Provide education and support for participant on nutrition & aerobic/resistive exercise along with prescribed medications to achieve LDL 70mg , HDL >40mg .    Expected Outcomes Short Term: Participant states understanding of desired cholesterol values and is compliant with medications prescribed. Participant is following exercise  prescription and nutrition guidelines.;Long Term: Cholesterol controlled with medications as prescribed, with individualized exercise RX and with personalized nutrition plan. Value goals: LDL < 70mg , HDL > 40 mg.             Education:Diabetes - Individual verbal and written instruction to review signs/symptoms of diabetes, desired ranges of glucose level fasting, after meals and with exercise. Acknowledge that pre and post exercise glucose checks will be done for 3 sessions at entry of program.   Know Your Numbers and Heart Failure: - Group verbal and visual instruction to discuss disease risk factors for cardiac and pulmonary disease and treatment options.  Reviews associated critical values for Overweight/Obesity, Hypertension, Cholesterol, and Diabetes.  Discusses basics of heart failure: signs/symptoms and treatments.  Introduces Heart Failure Zone chart for action plan for heart failure.  Written material given at  graduation.   Core Components/Risk Factors/Patient Goals Review:    Core Components/Risk Factors/Patient Goals at Discharge (Final Review):    ITP Comments:  ITP Comments     Row Name 12/03/21 1430 12/16/21 1705         ITP Comments Virtual orientation call completed today. he has an appointment on Date: 12/16/21  for EP eval and gym Orientation.  Documentation of diagnosis can be found in [facility] Date: 10/28/21 . Completed and gym orientation. Initial ITP created and sent for review to Dr. Vida Rigger, Medical Director.               Comments: Initial ITP

## 2021-12-21 ENCOUNTER — Encounter: Payer: Medicare HMO | Admitting: *Deleted

## 2021-12-21 DIAGNOSIS — J8410 Pulmonary fibrosis, unspecified: Secondary | ICD-10-CM | POA: Diagnosis not present

## 2021-12-21 NOTE — Progress Notes (Signed)
Daily Session Note  Patient Details  Name: Sean Waters MRN: [id number] Date of Birth: 1940/11/27 Referring Provider:   Flowsheet Row Pulmonary Rehab from 12/16/2021 in [facility] Cardiac and Pulmonary Rehab  Referring Provider Wallene Huh MD       Encounter Date: 12/21/2021  Check In:  Session Check In - 12/21/21 1428       Check-In   Supervising physician immediately available to respond to emergencies See telemetry face sheet for immediately available ER MD    Location [facility]-Cardiac & Pulmonary Rehab    Staff Present Nyoka Cowden, RN, BSN, Walden Field, BS, RRT, CPFT;Melissa Caiola, RDN, LDN    Virtual Visit No    Medication changes reported     No    Fall or balance concerns reported    No    Tobacco Cessation Use Increase    Warm-up and Cool-down Performed on first and last piece of equipment    Resistance Training Performed Yes    VAD Patient? No    PAD/SET Patient? No      Pain Assessment   Currently in Pain? No/denies                Social History   Tobacco Use  Smoking Status Former   Packs/day: 1.00   Types: Cigarettes   Quit date: 03/17/1953   Years since quitting: 68.8  Smokeless Tobacco Never    Goals Met:  Independence with exercise equipment Exercise tolerated well No report of concerns or symptoms today  Goals Unmet:  Not Applicable  Comments: Pt able to follow exercise prescription today without complaint.  Will continue to monitor for progression.    Dr. Emily Filbert is Medical Director for [place].  Dr. Ottie Glazier is Medical Director for [facility] Pulmonary Rehabilitation.

## 2021-12-23 ENCOUNTER — Encounter: Payer: Self-pay | Admitting: *Deleted

## 2021-12-23 ENCOUNTER — Encounter: Payer: Medicare HMO | Admitting: *Deleted

## 2021-12-23 DIAGNOSIS — J8410 Pulmonary fibrosis, unspecified: Secondary | ICD-10-CM

## 2021-12-23 NOTE — Progress Notes (Signed)
Daily Session Note  Patient Details  Name: Sean Waters MRN: [id number] Date of Birth: May 21, 1940 Referring Provider:   Flowsheet Row Pulmonary Rehab from 12/16/2021 in [facility] Cardiac and Pulmonary Rehab  Referring Provider Wallene Huh MD       Encounter Date: 12/23/2021  Check In:  Session Check In - 12/23/21 1352       Check-In   Supervising physician immediately available to respond to emergencies See telemetry face sheet for immediately available ER MD    Location [facility]-Cardiac & Pulmonary Rehab    Staff Present [name], RN, BSN, Jacklynn Bue, MS, ASCM CEP, Exercise Physiologist;Laureen Owens Shark, BS, RRT, CPFT    Virtual Visit No    Medication changes reported     No    Fall or balance concerns reported    No    Warm-up and Cool-down Performed on first and last piece of equipment    Resistance Training Performed Yes    VAD Patient? No    PAD/SET Patient? No      Pain Assessment   Currently in Pain? No/denies                Social History   Tobacco Use  Smoking Status Former   Packs/day: 1.00   Types: Cigarettes   Quit date: 03/17/1953   Years since quitting: 68.8  Smokeless Tobacco Never    Goals Met:  Proper associated with RPD/PD & O2 Sat Independence with exercise equipment Exercise tolerated well No report of concerns or symptoms today  Goals Unmet:  Not Applicable  Comments: Pt able to follow exercise prescription today without complaint.  Will continue to monitor for progression.    Dr. Emily Filbert is Medical Director for [place].  Dr. Ottie Glazier is Medical Director for [facility] Pulmonary Rehabilitation.

## 2021-12-23 NOTE — Progress Notes (Signed)
Pulmonary Individual Treatment Plan  Patient Details  Name: Sean Waters MRN: [id number] Date of Birth: 01-08-1941 Referring Provider:   Flowsheet Row Pulmonary Rehab from 12/16/2021 in [facility] Cardiac and Pulmonary Rehab  Referring Provider Wallene Huh MD       Initial Encounter Date:  Flowsheet Row Pulmonary Rehab from 12/16/2021 in [facility] Cardiac and Pulmonary Rehab  Date 12/16/21       Visit Diagnosis: Pulmonary fibrosis ([name])  Patient's Home Medications on Admission:  Current Outpatient Medications:    albuterol (VENTOLIN HFA) 108 (90 Base) MCG/ACT inhaler, Inhale 2 puffs into the lungs every 6 (six) hours as needed., Disp: , Rfl:    ALPRAZolam (XANAX) 0.25 MG tablet, Take 0.25 mg by mouth as needed for anxiety., Disp: , Rfl:    enalapril (VASOTEC) 10 MG tablet, Take 1.5 tablets by mouth daily., Disp: , Rfl:    ezetimibe-simvastatin (VYTORIN) 10-10 MG tablet, Take 1 tablet by mouth at bedtime., Disp: , Rfl:    levothyroxine (SYNTHROID) 50 MCG tablet, Take 50 mcg by mouth daily., Disp: , Rfl:    predniSONE (DELTASONE) 10 MG tablet, Take 1 tablet by mouth daily., Disp: , Rfl:    predniSONE (DELTASONE) 5 MG tablet, Take by mouth., Disp: , Rfl:    tadalafil (CIALIS) 5 MG tablet, Take by mouth., Disp: , Rfl:   Past Medical History: No past medical history on file.  Tobacco Use: Social History   Tobacco Use  Smoking Status Former   Packs/day: 1.00   Types: Cigarettes   Quit date: 03/17/1953   Years since quitting: 68.8  Smokeless Tobacco Never    Labs: Review Flowsheet        No data to display           Pulmonary Assessment Scores:  Pulmonary Assessment Scores     Row Name 12/16/21 1707         ADL UCSD   ADL Phase Entry     SOB Score total 17     Rest 0     Walk 1     Stairs 1     Bath 0     Dress 0     Shop 1       CAT Score   CAT Score 4       mMRC Score   mMRC Score 1              UCSD: Self-administered rating of dyspnea  associated with activities of daily living (ADLs) 6-point scale (0 = "not at all" to 5 = "maximal or unable to do because of breathlessness")  Scoring Scores range from 0 to 120.  Minimally important difference is 5 units  CAT: CAT can identify the health impairment of COPD patients and is better correlated with disease progression.  CAT has a scoring range of zero to 40. The CAT score is classified into four groups of low (less than 10), medium (10 - 20), high (21-30) and very high (31-40) based on the impact level of disease on health status. A CAT score over 10 suggests significant symptoms.  A worsening CAT score could be explained by an exacerbation, poor medication adherence, poor inhaler technique, or progression of COPD or comorbid conditions.  CAT MCID is 2 points  mMRC: mMRC (Modified Medical Research Council) Dyspnea Scale is used to assess the degree of baseline functional disability in patients of respiratory disease due to dyspnea. No minimal important difference is established. A decrease in score of 1 point  or greater is considered a positive change.   Pulmonary Function Assessment:   Exercise Target Goals: Exercise Program Goal: Individual exercise prescription set using results from initial 6 min walk test and THRR while considering  patient's activity barriers and safety.   Exercise Prescription Goal: Initial exercise prescription builds to 30-45 minutes a day of aerobic activity, 2-3 days per week.  Home exercise guidelines will be given to patient during program as part of exercise prescription that the participant will acknowledge.  Education: Aerobic Exercise: - Group verbal and visual presentation on the components of exercise prescription. Introduces F.I.T.T principle from ACSM for exercise prescriptions.  Reviews F.I.T.T. principles of aerobic exercise including progression. Written material given at graduation. Flowsheet Row [facility]monary [facility]  Card[facility] and Pulmonary Rehab  Education need identified 12/16/21       Education: Resistance Exercise: - Group verbal and visual presentation on the components of exercise prescription. Introduces F.I.T.T principle from ACSM for exercise prescriptions  Reviews F.I.T.T. principles of resistance exercise including progression. Written material given at graduation.    Education: Exercise & Equipment Safety: - Individual verbal instruction and demonstration of equipment use and safety with use of the equipment. Flowsheet Row [facility]monary [facility]  Education need identified 12/16/21  Date 12/16/21  Educator KL  Instruction Review Code 1- Verbalizes Understanding       Education: Exercise Physiology & General Exercise Guidelines: - Group verbal and written instruction with models to review the exercise physiology of the cardiovascular system and associated critical values. Provides general exercise guidelines with specific guidelines to those with heart or lung disease.    Education: Flexibility, Balance, Mind/Body Relaxation: - Group verbal and visual presentation with interactive activity on the components of exercise prescription. Introduces F.I.T.T principle from ACSM for exercise prescriptions. Reviews F.I.T.T. principles of flexibility and balance exercise training including progression. Also discusses the mind body connection.  Reviews various relaxation techniques to help reduce and manage stress (i.e. Deep breathing, progressive muscle relaxation, and visualization). Balance handout provided to take home. Written material given at graduation.   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 HR 105 bpm     Max Ex. BP 152/72     2 Minute Post BP 132/76       Interval HR   1 Minute HR 79     2 Minute HR 94  3 Minute HR 105     4 Minute HR 105     5 Minute HR 100     6 Minute HR 105     2 Minute Post HR 70     Interval Heart Rate? Yes       Interval Oxygen   Interval Oxygen? Yes     Baseline Oxygen Saturation % 95 %     1 Minute Oxygen Saturation % 92 %     1 Minute Liters of Oxygen 0 L  RA     2 Minute Oxygen Saturation % 90 %     2 Minute Liters of Oxygen 0 L     3 Minute Oxygen Saturation % 88 %     3 Minute Liters of Oxygen 0 L     4 Minute Oxygen Saturation % 89 %     4 Minute Liters of Oxygen 0 L     5 Minute Oxygen Saturation % 88 %     5 Minute Liters of Oxygen 0 L     6 Minute Oxygen Saturation % 87 %     6 Minute Liters of Oxygen 0 L     2 Minute Post Oxygen Saturation % 96 %     2 Minute Post Liters of Oxygen 0 L             Oxygen Initial Assessment:  Oxygen Initial Assessment - 12/16/21 1707       Home Oxygen   Home Oxygen Device None    Sleep Oxygen Prescription CPAP    Liters per minute 5    Home Exercise Oxygen Prescription None    Home Resting Oxygen Prescription None    Compliance with Home Oxygen Use No      Initial 6 min Walk   Oxygen Used None      Program Oxygen Prescription   Program Oxygen Prescription None      Intervention   Short Term Goals To learn and understand importance of monitoring SPO2 with pulse oximeter and demonstrate accurate use of the pulse oximeter.;To learn and understand importance of maintaining oxygen saturations>88%;To  learn and demonstrate proper pursed lip breathing techniques or other breathing techniques. ;To learn and demonstrate proper use of respiratory medications    Long  Term Goals Compliance with respiratory medication;Exhibits proper breathing techniques, such as pursed lip breathing or other method taught during program session;Maintenance of O2 saturations>88%;Verbalizes importance of monitoring SPO2 with pulse oximeter and return demonstration             Oxygen Re-Evaluation:   Oxygen Discharge (Final Oxygen Re-Evaluation):   Initial Exercise Prescription:  Initial Exercise Prescription - 12/16/21 1700       Date of Initial Exercise RX and Referring Provider   Date 12/16/21    Referring Provider Ned Clines MD      Oxygen   Maintain Oxygen Saturation 88% or higher      Treadmill   MPH 2    Grade 0.5    Minutes 15    METs 2.53      Recumbant Elliptical   Level 1    Watts 50    Minutes 15    METs 1.9      REL-XR   Level 1    Speed 50    Minutes 15    METs 1.9      Prescription Details   Frequency (times per week) 2    Duration Progress to 30 minutes of  continuous aerobic without signs/symptoms of physical distress      Intensity   THRR 40-80% of Max Heartrate 95 - 124    Ratings of Perceived Exertion 11-13    Perceived Dyspnea 0-4      Progression   Progression Continue to progress workloads to maintain intensity without signs/symptoms of physical distress.      Resistance Training   Training Prescription Yes    Weight 4 lb    Reps 10-15             Perform Capillary Blood Glucose checks as needed.  Exercise Prescription Changes:   Exercise Prescription Changes     Row Name 12/16/21 1700             Response to Exercise   Blood Pressure (Admit) 140/74       Blood Pressure (Exercise) 166/74       Blood Pressure (Exit) 132/76       Heart Rate (Admit) 66 bpm       Heart Rate (Exercise) 105 bpm       Heart Rate (Exit) 70 bpm        Oxygen Saturation (Admit) 95 %       Oxygen Saturation (Exercise) 88 %       Oxygen Saturation (Exit) 96 %       Rating of Perceived Exertion (Exercise) 11       Perceived Dyspnea (Exercise) 0       Symptoms none       Comments results                Exercise Comments:   Exercise Goals and Review:   Exercise Goals     Row Name 12/16/21 1720             Exercise Goals   Increase Physical Activity Yes       Intervention Provide advice, education, support and counseling about physical activity/exercise needs.;Develop an individualized exercise prescription for aerobic and resistive training based on initial evaluation findings, risk stratification, comorbidities and participant's personal goals.       Expected Outcomes Short Term: Attend rehab on a regular basis to increase amount of physical activity.;Long Term: Add in home exercise to make exercise part of routine and to increase amount of physical activity.;Long Term: Exercising regularly at least 3-5 days a week.       Increase Strength and Stamina Yes       Intervention Provide advice, education, support and counseling about physical activity/exercise needs.;Develop an individualized exercise prescription for aerobic and resistive training based on initial evaluation findings, risk stratification, comorbidities and participant's personal goals.       Expected Outcomes Short Term: Increase workloads from initial exercise prescription for resistance, speed, and METs.;Short Term: Perform resistance training exercises routinely during rehab and add in resistance training at home;Long Term: Improve cardiorespiratory fitness, muscular endurance and strength as measured by increased METs and functional capacity ( )       Able to understand and use rate of perceived exertion (RPE) scale Yes       Intervention Provide education and explanation on how to use RPE scale       Expected Outcomes Short Term: Able to use RPE daily in  rehab to express subjective intensity level;Long Term:  Able to use RPE to guide intensity level when exercising independently       Able to understand and use Dyspnea scale Yes       Intervention  Provide education and explanation on how to use Dyspnea scale       Expected Outcomes Short Term: Able to use Dyspnea scale daily in rehab to express subjective sense of shortness of breath during exertion;Long Term: Able to use Dyspnea scale to guide intensity level when exercising independently       Knowledge and understanding of Target Heart Rate Range (THRR) Yes       Intervention Provide education and explanation of THRR including how the numbers were predicted and where they are located for reference       Expected Outcomes Short Term: Able to state/look up THRR;Long Term: Able to use THRR to govern intensity when exercising independently;Short Term: Able to use daily as guideline for intensity in rehab       Able to check pulse independently Yes       Intervention Review the importance of being able to check your own pulse for safety during independent exercise;Provide education and demonstration on how to check pulse in carotid and radial arteries.       Expected Outcomes Short Term: Able to explain why pulse checking is important during independent exercise;Long Term: Able to check pulse independently and accurately       Understanding of Exercise Prescription Yes       Intervention Provide education, explanation, and written materials on patient's individual exercise prescription       Expected Outcomes Short Term: Able to explain program exercise prescription;Long Term: Able to explain home exercise prescription to exercise independently                Exercise Goals Re-Evaluation :   Discharge Exercise Prescription (Final Exercise Prescription Changes):  Exercise Prescription Changes - 12/16/21 1700       Response to Exercise   Blood Pressure (Admit) 140/74    Blood Pressure  (Exercise) 166/74    Blood Pressure (Exit) 132/76    Heart Rate (Admit) 66 bpm    Heart Rate (Exercise) 105 bpm    Heart Rate (Exit) 70 bpm    Oxygen Saturation (Admit) 95 %    Oxygen Saturation (Exercise) 88 %    Oxygen Saturation (Exit) 96 %    Rating of Perceived Exertion (Exercise) 11    Perceived Dyspnea (Exercise) 0    Symptoms none    Comments results             Nutrition:  Target Goals: Understanding of nutrition guidelines, daily intake of sodium 1500mg , cholesterol 200mg , calories 30% from fat and 7% or less from saturated fats, daily to have 5 or more servings of fruits and vegetables.  Education: All About Nutrition: -Group instruction provided by verbal, written material, interactive activities, discussions, models, and posters to present general guidelines for heart healthy nutrition including fat, fiber, MyPlate, the role of sodium in heart healthy nutrition, utilization of the nutrition label, and utilization of this knowledge for meal planning. Follow up email sent as well. Written material given at graduation.   Biometrics:  Pre Biometrics - 12/16/21 1708       Pre Biometrics   Height 5\' 4"  (1.626 m)    Weight 185 lb 3.2 oz (84 kg)    BMI (Calculated) 31.77    Single Leg Stand 24.3 seconds              Nutrition Therapy Plan and Nutrition Goals:  Nutrition Therapy & Goals - 12/16/21 1721       Intervention Plan   Intervention Prescribe,  educate and counsel regarding individualized specific dietary modifications aiming towards targeted core components such as weight, hypertension, lipid management, diabetes, heart failure and other comorbidities.    Expected Outcomes Short Term Goal: Understand basic principles of dietary content, such as calories, fat, sodium, cholesterol and nutrients.;Short Term Goal: A plan has been developed with personal nutrition goals set during dietitian appointment.;Long Term Goal: Adherence to prescribed nutrition  plan.             Nutrition Assessments:  MEDIFICTS Score Key: ?70 Need to make dietary changes  40-70 Heart Healthy Diet ? 40 Therapeutic Level Cholesterol Diet  Flowsheet Row Pulmonary Rehab from 12/16/2021 in [facility] Cardiac and Pulmonary Rehab  Picture Your Plate Total Score on Admission 68      Picture Your Plate Scores: <16 Unhealthy dietary pattern with much room for improvement. 41-50 Dietary pattern unlikely to meet recommendations for good health and room for improvement. 51-60 More healthful dietary pattern, with some room for improvement.  >60 Healthy dietary pattern, although there may be some specific behaviors that could be improved.   Nutrition Goals Re-Evaluation:   Nutrition Goals Discharge (Final Nutrition Goals Re-Evaluation):   Psychosocial: Target Goals: Acknowledge presence or absence of significant depression and/or stress, maximize coping skills, provide positive support system. Participant is able to verbalize types and ability to use techniques and skills needed for reducing stress and depression.   Education: Stress, Anxiety, and Depression - Group verbal and visual presentation to define topics covered.  Reviews how body is impacted by stress, anxiety, and depression.  Also discusses healthy ways to reduce stress and to treat/manage anxiety and depression.  Written material given at graduation.   Education: Sleep Hygiene -Provides group verbal and written instruction about how sleep can affect your health.  Define sleep hygiene, discuss sleep cycles and impact of sleep habits. Review good sleep hygiene tips.    Initial Review & Psychosocial Screening:  Initial Psych Review & Screening - 12/03/21 1329       Initial Review   Current issues with Current Stress Concerns    Source of Stress Concerns Chronic Illness    Comments He reports feeling stress with his diagnosis. He has called a patient advocate and told them what Dr. Meredeth Ide said about  his prognosis and that he didn't feel like he was supported; he didn't have someone on staff to talk to after being given his prognosis. When he got home he was depressed, lost 15 lbs, and thought he was going to die. He was looking for guidance and didn't feel like he got it - he spoke to patient relations and did not get a response. He reports that he was diagnosed in March and he was the one to ask for a referall to rehab.      Family Dynamics   Good Support System? 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     Barriers   Psychosocial barriers to participate in program The patient should benefit from training in stress management and relaxation.      Screening Interventions   Interventions Encouraged to exercise;Provide feedback about the scores to participant;To provide support and resources with identified psychosocial needs    Expected Outcomes Short Term goal: Utilizing psychosocial counselor, staff and physician to assist with identification of specific Stressors or current issues interfering with healing process. Setting desired goal for each stressor or current issue identified.;Long  Term Goal: Stressors or current issues are controlled or eliminated.;Short Term goal: Identification and review with participant of any Quality of Life or Depression concerns found by scoring the questionnaire.;Long Term goal: The participant improves quality of Life and PHQ9 Scores as seen by post scores and/or verbalization of changes             Quality of Life Scores:  Scores of 19 and below usually indicate a poorer quality of life in these areas.  A difference of  2-3 points is a clinically meaningful difference.  A difference of 2-3 points in the total score of the Quality of Life Index has been associated with significant improvement in overall quality of life, self-image, physical symptoms, and general health in studies assessing  change in quality of life.  PHQ-9: Review Flowsheet       12/16/2021  Depression screen PHQ 2/9  Decreased Interest 0  Down, Depressed, Hopeless 0  PHQ - 2 Score 0  Altered sleeping 0  Tired, decreased energy 1  Change in appetite 0  Feeling bad or failure about yourself  0  Trouble concentrating 0  Moving slowly or fidgety/restless 0  Suicidal thoughts 0  PHQ-9 Score 1  Difficult doing work/chores Somewhat difficult   Interpretation of Total Score  Total Score Depression Severity:  1-4 = Minimal depression, 5-9 = Mild depression, 10-14 = Moderate depression, 15-19 = Moderately severe depression, 20-27 = Severe depression   Psychosocial Evaluation and Intervention:  Psychosocial Evaluation - 12/03/21 1433       Psychosocial Evaluation & Interventions   Interventions Stress management education;Relaxation education;Encouraged to exercise with the program and follow exercise prescription    Comments He reports feeling stress with his diagnosis. He has called a patient advocate and told them what Dr. Meredeth Ide said about his prognosis and that he didn't feel like he was supported; he didn't have someone on staff to talk to after being given his prognosis. When he got home he was depressed, lost 15 lbs, and thought he was going to die. He was looking for guidance and didn't feel like he got it - he spoke to patient relations and did not get a response. He reports that he was diagnosed in March and he was the one to ask for a referall to rehab. 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 He also has two children and 6 grandchildren in [place] who he visits. He has been retired and is still working on relaxing in the afternoon instead of being productive; in the mornings he exercises and plays the piano. For exercise he will use the stepper and recumbent bike at home: he alternates the  time from 1.5 hours and 30 minutes alternating days. He uses a pulse ox to monitor his HR and O2; he keeps his HR around 100 during exercise. He is excited to start Pulmonary Rehab.    Expected Outcomes ST: attend all scheduled exercise/education appointments, progress with exercise prescription while maintaining SpO2 <88%  LT: Improve shortness of breath with ADLs    Continue Psychosocial Services  Follow up required by staff             Psychosocial Re-Evaluation:   Psychosocial Discharge (Final Psychosocial Re-Evaluation):   Education: Education Goals: Education classes will be provided on a weekly basis, covering required topics. Participant will state understanding/return demonstration of  topics presented.  Learning Barriers/Preferences:   General Pulmonary Education Topics:  Infection Prevention: - Provides verbal and written material to individual with discussion of infection control including proper hand washing and proper equipment cleaning during exercise session. Flowsheet Row Pulmonary Rehab from 12/16/2021 in [facility] Cardiac and Pulmonary Rehab  Education need identified 12/16/21  Date 12/16/21  Educator KL  Instruction Review Code 1- Verbalizes Understanding       Falls Prevention: - Provides verbal and written material to individual with discussion of falls prevention and safety. Flowsheet Row Pulmonary Rehab from 12/03/2021 in [facility] Cardiac and Pulmonary Rehab  Education need identified 12/03/21  Date 12/03/21  Educator [facility]  Instruction Review Code 1- Verbalizes Understanding       Chronic Lung Disease Review: - Group verbal instruction with posters, models, PowerPoint presentations and videos,  to review new updates, new respiratory medications, new advancements in procedures and treatments. Providing information on websites and "800" numbers for continued self-education. Includes information about supplement oxygen, available portable oxygen systems,  continuous and intermittent flow rates, oxygen safety, concentrators, and Medicare reimbursement for oxygen. Explanation of Pulmonary Drugs, including class, frequency, complications, importance of spacers, rinsing mouth after steroid MDI's, and proper cleaning methods for nebulizers. Review of basic lung anatomy and physiology related to function, structure, and complications of lung disease. Review of risk factors. Discussion about methods for diagnosing sleep apnea and types of masks and machines for OSA. Includes a review of the use of types of environmental controls: home humidity, furnaces, filters, dust mite/pet prevention, HEPA vacuums. Discussion about weather changes, air quality and the benefits of nasal washing. Instruction on Warning signs, infection symptoms, calling MD promptly, preventive modes, and value of vaccinations. Review of effective airway clearance, coughing and/or vibration techniques. Emphasizing that all should Create an Action Plan. Written material given at graduation. Flowsheet Row Pulmonary Rehab from 12/16/2021 in [facility] Cardiac and Pulmonary Rehab  Education need identified 12/16/21       AED/CPR: - Group verbal and written instruction with the use of models to demonstrate the basic use of the AED with the basic ABC's of resuscitation.    Anatomy and Cardiac Procedures: - Group verbal and visual presentation and models provide information about basic cardiac anatomy and function. Reviews the testing methods done to diagnose heart disease and the outcomes of the test results. Describes the treatment choices: Medical Management, Angioplasty, or Coronary Bypass Surgery for treating various heart conditions including Myocardial Infarction, Angina, Valve Disease, and Cardiac Arrhythmias.  Written material given at graduation.   Medication Safety: - Group verbal and visual instruction to review commonly prescribed medications for heart and lung disease. Reviews the  medication, class of the drug, and side effects. Includes the steps to properly store meds and maintain the prescription regimen.  Written material given at graduation.   Other: -Provides group and verbal instruction on various topics (see comments)   Knowledge Questionnaire Score:  Knowledge Questionnaire Score - 12/16/21 1706       Knowledge Questionnaire Score   Pre Score 13/18              Core Components/Risk Factors/Patient Goals at Admission:  Personal Goals and Risk Factors at Admission - 12/16/21 1722       Core Components/Risk Factors/Patient Goals on Admission    Weight Management Yes;Weight Loss    Intervention Weight Management: Develop a combined nutrition and exercise program designed to reach desired caloric intake, while maintaining appropriate intake of nutrient and fiber, sodium and  fats, and appropriate energy expenditure required for the weight goal.;Weight Management: Provide education and appropriate resources to help participant work on and attain dietary goals.;Weight Management/Obesity: Establish reasonable short term and long term weight goals.    Admit Weight 185 lb (83.9 kg)    Goal Weight: Short Term 180 lb (81.6 kg)    Goal Weight: Long Term 175 lb (79.4 kg)    Expected Outcomes Short Term: Continue to assess and modify interventions until short term weight is achieved;Long Term: Adherence to nutrition and physical activity/exercise program aimed toward attainment of established weight goal;Understanding recommendations for meals to include 15-35% energy as protein, 25-35% energy from fat, 35-60% energy from carbohydrates, less than 200mg  of dietary cholesterol, 20-35 gm of total fiber daily;Understanding of distribution of calorie intake throughout the day with the consumption of 4-5 meals/snacks;Weight Loss: Understanding of general recommendations for a balanced deficit meal plan, which promotes 1-2 lb weight loss per week and includes a negative energy  balance of [phone] kcal/d    Improve shortness of breath with ADL's Yes    Intervention Provide education, individualized exercise plan and daily activity instruction to help decrease symptoms of SOB with activities of daily living.    Expected Outcomes Short Term: Improve cardiorespiratory fitness to achieve a reduction of symptoms when performing ADLs;Long Term: Be able to perform more ADLs without symptoms or delay the onset of symptoms    Increase knowledge of respiratory medications and ability to use respiratory devices properly  Yes    Intervention Provide education and demonstration as needed of appropriate use of medications, inhalers, and oxygen therapy.    Expected Outcomes Short Term: Achieves understanding of medications use. Understands that oxygen is a medication prescribed by physician. Demonstrates appropriate use of inhaler and oxygen therapy.;Long Term: Maintain appropriate use of medications, inhalers, and oxygen therapy.    Hypertension Yes    Intervention Provide education on lifestyle modifcations including regular physical activity/exercise, weight management, moderate sodium restriction and increased consumption of fresh fruit, vegetables, and low fat dairy, alcohol moderation, and smoking cessation.;Monitor prescription use compliance.    Expected Outcomes Short Term: Continued assessment and intervention until BP is < 140/60mm HG in hypertensive participants. < 130/39mm HG in hypertensive participants with diabetes, heart failure or chronic kidney disease.;Long Term: Maintenance of blood pressure at goal levels.    Lipids Yes    Intervention Provide education and support for participant on nutrition & aerobic/resistive exercise along with prescribed medications to achieve LDL 70mg , HDL >40mg .    Expected Outcomes Short Term: Participant states understanding of desired cholesterol values and is compliant with medications prescribed. Participant is following exercise  prescription and nutrition guidelines.;Long Term: Cholesterol controlled with medications as prescribed, with individualized exercise RX and with personalized nutrition plan. Value goals: LDL < 70mg , HDL > 40 mg.             Education:Diabetes - Individual verbal and written instruction to review signs/symptoms of diabetes, desired ranges of glucose level fasting, after meals and with exercise. Acknowledge that pre and post exercise glucose checks will be done for 3 sessions at entry of program.   Know Your Numbers and Heart Failure: - Group verbal and visual instruction to discuss disease risk factors for cardiac and pulmonary disease and treatment options.  Reviews associated critical values for Overweight/Obesity, Hypertension, Cholesterol, and Diabetes.  Discusses basics of heart failure: signs/symptoms and treatments.  Introduces Heart Failure Zone chart for action plan for heart failure.  Written material given at  graduation.   Core Components/Risk Factors/Patient Goals Review:    Core Components/Risk Factors/Patient Goals at Discharge (Final Review):    ITP Comments:  ITP Comments     Row Name 12/03/21 1430 12/16/21 1705 12/23/21 0955       ITP Comments Virtual orientation call completed today. he has an appointment on Date: 12/16/21  for EP eval and gym Orientation.  Documentation of diagnosis can be found in [facility] Date: 10/28/21 . Completed and gym orientation. Initial ITP created and sent for review to Dr. Vida Rigger, Medical Director. 30 Day review completed. Medical Director ITP review done, changes made as directed, and signed approval by Medical Director.    NEW              Comments:

## 2021-12-28 ENCOUNTER — Encounter: Payer: Medicare HMO | Admitting: *Deleted

## 2021-12-28 DIAGNOSIS — J8410 Pulmonary fibrosis, unspecified: Secondary | ICD-10-CM | POA: Diagnosis not present

## 2021-12-28 NOTE — Progress Notes (Signed)
Daily Session Note  Patient Details  Name: Sean Waters MRN: [id number] Date of Birth: April 29, 1941 Referring Provider:   Flowsheet Row Pulmonary Rehab from 12/16/2021 in [facility] Cardiac and Pulmonary Rehab  Referring Provider Wallene Huh MD       Encounter Date: 12/28/2021  Check In:  Session Check In - 12/28/21 1338       Check-In   Supervising physician immediately available to respond to emergencies See telemetry face sheet for immediately available ER MD    Location [facility]-Cardiac & Pulmonary Rehab    Staff Present Renita Papa, RN Margurite Auerbach, MS, ASCM CEP, Exercise Physiologist;Melissa Caiola, RDN, LDN    Virtual Visit No    Medication changes reported     No    Fall or balance concerns reported    No    Warm-up and Cool-down Performed on first and last piece of equipment    Resistance Training Performed Yes    VAD Patient? No    PAD/SET Patient? No      Pain Assessment   Currently in Pain? No/denies                Social History   Tobacco Use  Smoking Status Former   Packs/day: 1.00   Types: Cigarettes   Quit date: 03/17/1953   Years since quitting: 68.8  Smokeless Tobacco Never    Goals Met:  Independence with exercise equipment Exercise tolerated well No report of concerns or symptoms today Strength training completed today  Goals Unmet:  Not Applicable  Comments: Pt able to follow exercise prescription today without complaint.  Will continue to monitor for progression.    Dr. Emily Filbert is Medical Director for [place].  Dr. Ottie Glazier is Medical Director for [facility] Pulmonary Rehabilitation.

## 2021-12-30 ENCOUNTER — Encounter: Payer: Medicare HMO | Admitting: *Deleted

## 2021-12-30 DIAGNOSIS — J8410 Pulmonary fibrosis, unspecified: Secondary | ICD-10-CM | POA: Diagnosis not present

## 2021-12-30 NOTE — Progress Notes (Signed)
Daily Session Note  Patient Details  Name: Sean Waters MRN: [id number] Date of Birth: 1940/09/22 Referring Provider:   Flowsheet Row Pulmonary Rehab from 12/16/2021 in [facility] Cardiac and Pulmonary Rehab  Referring Provider Wallene Huh MD       Encounter Date: 12/30/2021  Check In:  Session Check In - 12/30/21 1334       Check-In   Supervising physician immediately available to respond to emergencies See telemetry face sheet for immediately available ER MD    Location [facility]-Cardiac & Pulmonary Rehab    Staff Present Renita Papa, RN BSN;Melissa Upland, RDN, Tawanna Solo, MS, ASCM CEP, Exercise Physiologist    Virtual Visit No    Medication changes reported     No    Fall or balance concerns reported    No    Warm-up and Cool-down Performed on first and last piece of equipment    Resistance Training Performed Yes    VAD Patient? No    PAD/SET Patient? No      Pain Assessment   Currently in Pain? No/denies                Social History   Tobacco Use  Smoking Status Former   Packs/day: 1.00   Types: Cigarettes   Quit date: 03/17/1953   Years since quitting: 68.8  Smokeless Tobacco Never    Goals Met:  Independence with exercise equipment Exercise tolerated well No report of concerns or symptoms today Strength training completed today  Goals Unmet:  Not Applicable  Comments: Pt able to follow exercise prescription today without complaint.  Will continue to monitor for progression.    Dr. Emily Filbert is Medical Director for [place].  Dr. Ottie Glazier is Medical Director for [facility] Pulmonary Rehabilitation.

## 2022-01-04 ENCOUNTER — Encounter: Payer: Medicare HMO | Admitting: *Deleted

## 2022-01-04 DIAGNOSIS — J8410 Pulmonary fibrosis, unspecified: Secondary | ICD-10-CM

## 2022-01-04 NOTE — Progress Notes (Signed)
Daily Session Note  Patient Details  Name: Sean Waters MRN: [id number] Date of Birth: 08/22/1940 Referring Provider:   Flowsheet Row Pulmonary Rehab from 12/16/2021 in [facility] Cardiac and Pulmonary Rehab  Referring Provider Wallene Huh MD       Encounter Date: 01/04/2022  Check In:  Session Check In - 01/04/22 1331       Check-In   Supervising physician immediately available to respond to emergencies See telemetry face sheet for immediately available ER MD    Location [facility]-Cardiac & Pulmonary Rehab    Staff Present Justin Mend, RCP,RRT,BSRT;Tomothy Eddins Sherryll Burger, RN BSN;Melissa Caiola, RDN, LDN    Virtual Visit No    Medication changes reported     No    Fall or balance concerns reported    No    Warm-up and Cool-down Performed on first and last piece of equipment    Resistance Training Performed Yes    VAD Patient? No    PAD/SET Patient? No      Pain Assessment   Currently in Pain? No/denies                Social History   Tobacco Use  Smoking Status Former   Packs/day: 1.00   Types: Cigarettes   Quit date: 03/17/1953   Years since quitting: 68.8  Smokeless Tobacco Never    Goals Met:  Independence with exercise equipment Exercise tolerated well No report of concerns or symptoms today Strength training completed today  Goals Unmet:  Not Applicable  Comments: Pt able to follow exercise prescription today without complaint.  Will continue to monitor for progression.    Dr. Emily Filbert is Medical Director for [place].  Dr. Ottie Glazier is Medical Director for [facility] Pulmonary Rehabilitation.

## 2022-01-06 ENCOUNTER — Encounter: Payer: Medicare HMO | Admitting: *Deleted

## 2022-01-06 DIAGNOSIS — J8410 Pulmonary fibrosis, unspecified: Secondary | ICD-10-CM | POA: Diagnosis not present

## 2022-01-06 NOTE — Progress Notes (Signed)
Daily Session Note  Patient Details  Name: Sean Waters MRN: [id number] Date of Birth: 12/18/1940 Referring Provider:   Flowsheet Row Pulmonary Rehab from 12/16/2021 in [facility] Cardiac and Pulmonary Rehab  Referring Provider Wallene Huh MD       Encounter Date: 01/06/2022  Check In:  Session Check In - 01/06/22 1403       Check-In   Supervising physician immediately available to respond to emergencies See telemetry face sheet for immediately available ER MD    Location [facility]-Cardiac & Pulmonary Rehab    Staff Present Justin Mend, RCP,RRT,BSRT;Melissa Tilford Pillar, RDN, LDN;Kaari Zeigler Sherryll Burger, RN BSN    Virtual Visit No    Medication changes reported     No    Fall or balance concerns reported    No    Warm-up and Cool-down Performed on first and last piece of equipment    Resistance Training Performed Yes    VAD Patient? No    PAD/SET Patient? No      Pain Assessment   Currently in Pain? No/denies                Social History   Tobacco Use  Smoking Status Former   Packs/day: 1.00   Types: Cigarettes   Quit date: 03/17/1953   Years since quitting: 68.8  Smokeless Tobacco Never    Goals Met:  Independence with exercise equipment Exercise tolerated well No report of concerns or symptoms today Strength training completed today  Goals Unmet:  Not Applicable  Comments: Pt able to follow exercise prescription today without complaint.  Will continue to monitor for progression.    Dr. Emily Filbert is Medical Director for [place].  Dr. Ottie Glazier is Medical Director for [facility] Pulmonary Rehabilitation.

## 2022-01-11 ENCOUNTER — Encounter: Payer: Medicare HMO | Admitting: *Deleted

## 2022-01-11 DIAGNOSIS — J8410 Pulmonary fibrosis, unspecified: Secondary | ICD-10-CM | POA: Diagnosis not present

## 2022-01-11 NOTE — Progress Notes (Signed)
Daily Session Note  Patient Details  Name: Sean Waters MRN: [id number] Date of Birth: [birth year] Referring Provider:   Flowsheet Row Pulmonary Rehab from 12/16/2021 in [facility] Cardiac and Pulmonary Rehab  Referring Provider Wallene Huh MD       Encounter Date: 01/11/2022  Check In:  Session Check In - 01/11/22 1411       Check-In   Supervising physician immediately available to respond to emergencies See telemetry face sheet for immediately available ER MD    Location [facility]-Cardiac & Pulmonary Rehab    Staff Present Renita Papa, RN BSN;Hager Tessie Fass, RCP,RRT,BSRT;Melissa [place], [place], LDN    Virtual Visit No    Medication changes reported     No    Fall or balance concerns reported    No    Warm-up and Cool-down Performed on first and last piece of equipment    Resistance Training Performed Yes    VAD Patient? No    PAD/SET Patient? No      Pain Assessment   Currently in Pain? No/denies                Social History   Tobacco Use  Smoking Status Former   Packs/day: 1.00   Types: Cigarettes   Quit date: 03/17/1953   Years since quitting: 68.8  Smokeless Tobacco Never    Goals Met:  Independence with exercise equipment Exercise tolerated well No report of concerns or symptoms today Strength training completed today  Goals Unmet:  Not Applicable  Comments: Pt able to follow exercise prescription today without complaint.  Will continue to monitor for progression.    Dr. Emily Filbert is Medical Director for [place].  Dr. Ottie Glazier is Medical Director for [facility] Pulmonary Rehabilitation.

## 2022-01-13 ENCOUNTER — Encounter: Payer: Medicare HMO | Admitting: *Deleted

## 2022-01-13 DIAGNOSIS — J8410 Pulmonary fibrosis, unspecified: Secondary | ICD-10-CM

## 2022-01-13 NOTE — Progress Notes (Signed)
Daily Session Note  Patient Details  Name: Sean Waters MRN: [id number] Date of Birth: Mar 28, 1941 Referring Provider:   Flowsheet Row Pulmonary Rehab from 12/16/2021 in [facility] Cardiac and Pulmonary Rehab  Referring Provider Wallene Huh MD       Encounter Date: 01/13/2022  Check In:  Session Check In - 01/13/22 1351       Check-In   Supervising physician immediately available to respond to emergencies See telemetry face sheet for immediately available ER MD    Location [facility]-Cardiac & Pulmonary Rehab    Staff Present Renita Papa, RN Margurite Auerbach, MS, ASCM CEP, Exercise Physiologist;Jessica Luan Pulling, MA, RCEP, CCRP, CCET    Virtual Visit No    Medication changes reported     No    Fall or balance concerns reported    No    Warm-up and Cool-down Performed on first and last piece of equipment    Resistance Training Performed Yes    VAD Patient? No    PAD/SET Patient? No      Pain Assessment   Currently in Pain? No/denies                Social History   Tobacco Use  Smoking Status Former   Packs/day: 1.00   Types: Cigarettes   Quit date: 03/17/1953   Years since quitting: 68.8  Smokeless Tobacco Never    Goals Met:  Independence with exercise equipment Exercise tolerated well No report of concerns or symptoms today Strength training completed today  Goals Unmet:  Not Applicable  Comments: Pt able to follow exercise prescription today without complaint.  Will continue to monitor for progression.    Dr. Emily Filbert is Medical Director for [place].  Dr. Ottie Glazier is Medical Director for [facility] Pulmonary Rehabilitation.

## 2022-01-20 ENCOUNTER — Encounter: Payer: Self-pay | Admitting: *Deleted

## 2022-01-20 ENCOUNTER — Encounter: Payer: Medicare HMO | Attending: Specialist | Admitting: *Deleted

## 2022-01-20 DIAGNOSIS — J8410 Pulmonary fibrosis, unspecified: Secondary | ICD-10-CM

## 2022-01-20 NOTE — Progress Notes (Signed)
Pulmonary Individual Treatment Plan  Patient Details  Name: Sean Waters MRN: [id number] Date of Birth: 01-08-1941 Referring Provider:   Flowsheet Row Pulmonary Rehab from 12/16/2021 in [facility] Cardiac and Pulmonary Rehab  Referring Provider Wallene Huh MD       Initial Encounter Date:  Flowsheet Row Pulmonary Rehab from 12/16/2021 in [facility] Cardiac and Pulmonary Rehab  Date 12/16/21       Visit Diagnosis: Pulmonary fibrosis ([name])  Patient's Home Medications on Admission:  Current Outpatient Medications:    albuterol (VENTOLIN HFA) 108 (90 Base) MCG/ACT inhaler, Inhale 2 puffs into the lungs every 6 (six) hours as needed., Disp: , Rfl:    ALPRAZolam (XANAX) 0.25 MG tablet, Take 0.25 mg by mouth as needed for anxiety., Disp: , Rfl:    enalapril (VASOTEC) 10 MG tablet, Take 1.5 tablets by mouth daily., Disp: , Rfl:    ezetimibe-simvastatin (VYTORIN) 10-10 MG tablet, Take 1 tablet by mouth at bedtime., Disp: , Rfl:    levothyroxine (SYNTHROID) 50 MCG tablet, Take 50 mcg by mouth daily., Disp: , Rfl:    predniSONE (DELTASONE) 10 MG tablet, Take 1 tablet by mouth daily., Disp: , Rfl:    predniSONE (DELTASONE) 5 MG tablet, Take by mouth., Disp: , Rfl:    tadalafil (CIALIS) 5 MG tablet, Take by mouth., Disp: , Rfl:   Past Medical History: No past medical history on file.  Tobacco Use: Social History   Tobacco Use  Smoking Status Former   Packs/day: 1.00   Types: Cigarettes   Quit date: 03/17/1953   Years since quitting: 68.8  Smokeless Tobacco Never    Labs: Review Flowsheet        No data to display           Pulmonary Assessment Scores:  Pulmonary Assessment Scores     Row Name 12/16/21 1707         ADL UCSD   ADL Phase Entry     SOB Score total 17     Rest 0     Walk 1     Stairs 1     Bath 0     Dress 0     Shop 1       CAT Score   CAT Score 4       mMRC Score   mMRC Score 1              UCSD: Self-administered rating of dyspnea  associated with activities of daily living (ADLs) 6-point scale (0 = "not at all" to 5 = "maximal or unable to do because of breathlessness")  Scoring Scores range from 0 to 120.  Minimally important difference is 5 units  CAT: CAT can identify the health impairment of COPD patients and is better correlated with disease progression.  CAT has a scoring range of zero to 40. The CAT score is classified into four groups of low (less than 10), medium (10 - 20), high (21-30) and very high (31-40) based on the impact level of disease on health status. A CAT score over 10 suggests significant symptoms.  A worsening CAT score could be explained by an exacerbation, poor medication adherence, poor inhaler technique, or progression of COPD or comorbid conditions.  CAT MCID is 2 points  mMRC: mMRC (Modified Medical Research Council) Dyspnea Scale is used to assess the degree of baseline functional disability in patients of respiratory disease due to dyspnea. No minimal important difference is established. A decrease in score of 1 point  or greater is considered a positive change.   Pulmonary Function Assessment:   Exercise Target Goals: Exercise Program Goal: Individual exercise prescription set using results from initial 6 min walk test and THRR while considering  patient's activity barriers and safety.   Exercise Prescription Goal: Initial exercise prescription builds to 30-45 minutes a day of aerobic activity, 2-3 days per week.  Home exercise guidelines will be given to patient during program as part of exercise prescription that the participant will acknowledge.  Education: Aerobic Exercise: - Group verbal and visual presentation on the components of exercise prescription. Introduces F.I.T.T principle from ACSM for exercise prescriptions.  Reviews F.I.T.T. principles of aerobic exercise including progression. Written material given at graduation. Flowsheet Row Pulmonary Rehab from 01/13/2022 in [facility]  Cardiac and Pulmonary Rehab  Education need identified 12/16/21       Education: Resistance Exercise: - Group verbal and visual presentation on the components of exercise prescription. Introduces F.I.T.T principle from ACSM for exercise prescriptions  Reviews F.I.T.T. principles of resistance exercise including progression. Written material given at graduation.    Education: Exercise & Equipment Safety: - Individual verbal instruction and demonstration of equipment use and safety with use of the equipment. Flowsheet Row Pulmonary Rehab from 01/13/2022 in [facility] Cardiac and Pulmonary Rehab  Education need identified 12/16/21  Date 12/16/21  Educator [place]  Instruction Review Code 1- Verbalizes Understanding       Education: Exercise Physiology & General Exercise Guidelines: - Group verbal and written instruction with models to review the exercise physiology of the cardiovascular system and associated critical values. Provides general exercise guidelines with specific guidelines to those with heart or lung disease.  Flowsheet Row Pulmonary Rehab from 01/13/2022 in [facility] Cardiac and Pulmonary Rehab  Date 01/13/22  Educator kl  Instruction Review Code 1- [employer] Understanding       Education: Flexibility, Balance, Mind/Body Relaxation: - Group verbal and visual presentation with interactive activity on the components of exercise prescription. Introduces F.I.T.T principle from ACSM for exercise prescriptions. Reviews F.I.T.T. principles of flexibility and balance exercise training including progression. Also discusses the mind body connection.  Reviews various relaxation techniques to help reduce and manage stress (i.e. Deep breathing, progressive muscle relaxation, and visualization). Balance handout provided to take home. Written material given at graduation.   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 HR 105 bpm     Max Ex. BP 152/72     2  Minute Post BP 132/76       Interval HR   1 Minute HR 79     2 Minute HR 94     3 Minute HR 105     4 Minute HR 105     5 Minute HR 100     6 Minute HR 105     2 Minute Post HR 70     Interval Heart Rate? Yes       Interval Oxygen   Interval Oxygen? Yes     Baseline Oxygen Saturation % 95 %     1 Minute Oxygen Saturation % 92 %     1 Minute Liters of Oxygen 0 L  RA     2 Minute Oxygen Saturation % 90 %     2 Minute Liters of Oxygen 0 L     3 Minute Oxygen Saturation % 88 %     3 Minute Liters of Oxygen 0 L     4 Minute Oxygen Saturation % 89 %     4 Minute Liters of Oxygen 0 L     5 Minute Oxygen Saturation % 88 %     5 Minute Liters of Oxygen 0 L     6 Minute Oxygen Saturation % 87 %     6 Minute Liters of Oxygen 0 L     2 Minute Post Oxygen Saturation % 96 %     2 Minute Post Liters of Oxygen 0 L             Oxygen Initial Assessment:  Oxygen Initial Assessment - 12/16/21 1707       Home Oxygen   Home Oxygen Device None    Sleep Oxygen Prescription CPAP    Liters per minute 5    Home Exercise Oxygen Prescription None    Home Resting Oxygen Prescription None    Compliance with Home Oxygen Use No      Initial 6 min Walk   Oxygen Used None      Program Oxygen Prescription   Program Oxygen Prescription None      Intervention   Short Term Goals To learn and understand  importance of monitoring SPO2 with pulse oximeter and demonstrate accurate use of the pulse oximeter.;To learn and understand importance of maintaining oxygen saturations>88%;To learn and demonstrate proper pursed lip breathing techniques or other breathing techniques. ;To learn and demonstrate proper use of respiratory medications    Long  Term Goals Compliance with respiratory medication;Exhibits proper breathing techniques, such as pursed lip breathing or other method taught during program session;Maintenance of O2 saturations>88%;Verbalizes importance of monitoring SPO2 with pulse oximeter and return demonstration             Oxygen Re-Evaluation:  Oxygen Re-Evaluation     Row Name 12/30/21 1353             Program Oxygen Prescription   Program Oxygen Prescription None         Home Oxygen   Home Oxygen Device None       Sleep Oxygen Prescription CPAP       Liters per minute 5       Home Exercise Oxygen Prescription None       Home Resting Oxygen Prescription None       Compliance with Home Oxygen Use Yes         Goals/Expected Outcomes   Short Term Goals To learn and understand importance of monitoring SPO2 with pulse oximeter  and demonstrate accurate use of the pulse oximeter.;To learn and understand importance of maintaining oxygen saturations>88%;To learn and demonstrate proper pursed lip breathing techniques or other breathing techniques. ;To learn and demonstrate proper use of respiratory medications       Long  Term Goals Compliance with respiratory medication;Exhibits proper breathing techniques, such as pursed lip breathing or other method taught during program session;Maintenance of O2 saturations>88%;Verbalizes importance of monitoring SPO2 with pulse oximeter and return demonstration                Oxygen Discharge (Final Oxygen Re-Evaluation):  Oxygen Re-Evaluation - 12/30/21 1353       Program Oxygen Prescription   Program Oxygen Prescription None       Home Oxygen   Home Oxygen Device None    Sleep Oxygen Prescription CPAP    Liters per minute 5    Home Exercise Oxygen Prescription None    Home Resting Oxygen Prescription None    Compliance with Home Oxygen Use Yes      Goals/Expected Outcomes   Short Term Goals To learn and understand importance of monitoring SPO2 with pulse oximeter and demonstrate accurate use of the pulse oximeter.;To learn and understand importance of maintaining oxygen saturations>88%;To learn and demonstrate proper pursed lip breathing techniques or other breathing techniques. ;To learn and demonstrate proper use of respiratory medications    Long  Term Goals Compliance with respiratory medication;Exhibits proper breathing techniques, such as pursed lip breathing or other method taught during program session;Maintenance of O2 saturations>88%;Verbalizes importance of monitoring SPO2 with pulse oximeter and return demonstration             Initial Exercise Prescription:  Initial Exercise Prescription - 12/16/21 1700       Date of Initial Exercise RX and Referring Provider   Date 12/16/21    Referring Provider Wallene Huh MD      Oxygen   Maintain Oxygen Saturation 88% or higher      Treadmill   MPH 2    Grade 0.5    Minutes 15    METs 2.53      Recumbant Elliptical   Level 1    Watts 50    Minutes 15    METs 1.9      REL-XR   Level 1    Speed 50    Minutes 15    METs 1.9      Prescription Details   Frequency (times per week) 2    Duration Progress to 30 minutes of continuous aerobic without signs/symptoms of physical distress      Intensity   THRR 40-80% of Max Heartrate 95 - 124    Ratings of Perceived Exertion 11-13    Perceived Dyspnea 0-4      Progression   Progression Continue to progress workloads to maintain intensity without signs/symptoms of physical distress.      Resistance Training   Training Prescription Yes    Weight 4 lb    Reps 10-15             Perform  Capillary Blood Glucose checks as needed.  Exercise Prescription Changes:   Exercise Prescription Changes     Row Name 12/16/21 1700 12/28/21 1100 01/04/22 1500 01/11/22 1400       Response to Exercise   Blood Pressure (Admit) 140/74 106/64 -- 124/64    Blood Pressure (Exercise) 166/74 148/68 -- 118/58    Blood Pressure (Exit) 132/76 120/70 -- 118/62    Heart Rate (  Admit) 66 bpm 75 bpm -- 92 bpm    Heart Rate (Exercise) 105 bpm 109 bpm -- 97 bpm    Heart Rate (Exit) 70 bpm 89 bpm -- 90 bpm    Oxygen Saturation (Admit) 95 % 94 % -- 94 %    Oxygen Saturation (Exercise) 88 % 88 % -- 89 %    Oxygen Saturation (Exit) 96 % 91 % -- 94 %    Rating of Perceived Exertion (Exercise) 11 11 -- 12    Perceived Dyspnea (Exercise) 0 -- -- 0    Symptoms none none -- none    Comments 6MWT results 2nd full day of exercise -- --    Duration -- Progress to 30 minutes of  aerobic without signs/symptoms of physical distress -- Continue with 30 min of aerobic exercise without signs/symptoms of physical distress.    Intensity -- THRR unchanged -- THRR unchanged      Progression   Progression -- Continue to progress workloads to maintain intensity without signs/symptoms of physical distress. -- Continue to progress workloads to maintain intensity without signs/symptoms of physical distress.    Average METs -- 2.39 -- 2.46      Resistance Training   Training Prescription -- Yes -- Yes    Weight -- 4 lb -- 5 lb    Reps -- 10-15 -- 10-15      Interval Training   Interval Training -- No -- No      Treadmill   MPH -- 2 -- 2    Grade -- 0.5 -- 0.5    Minutes -- 15 -- 15    METs -- 2.67 -- 2.67      NuStep   Level -- -- -- 4    Minutes -- -- -- 15    METs -- -- -- 2.2      Recumbant Elliptical   Level -- 1.4 -- 1.4    Minutes -- 15 -- 15      REL-XR   Level -- 3 -- 3    Minutes -- 15 -- 15    METs -- 2.2 -- 2      Biostep-RELP   Level -- -- -- 1    Minutes -- -- -- 15    METs -- -- -- 3       Home Exercise Plan   Plans to continue exercise at -- -- Home (comment)  recumbent bike, stepper, walking Home (comment)  recumbent bike, stepper, walking    Frequency -- -- Add 3 additional days to program exercise sessions. Add 3 additional days to program exercise sessions.    Initial Home Exercises Provided -- -- 01/04/22 01/04/22      Oxygen   Maintain Oxygen Saturation -- 88% or higher 88% or higher 88% or higher             Exercise Comments:   Exercise Goals and Review:   Exercise Goals     Row Name 12/16/21 1720             Exercise Goals   Increase Physical Activity Yes       Intervention Provide advice, education, support and counseling about physical activity/exercise needs.;Develop an individualized exercise prescription for aerobic and resistive training based on initial evaluation findings, risk stratification, comorbidities and participant's personal goals.       Expected Outcomes Short Term: Attend rehab on a regular basis to increase amount of physical activity.;Long Term: Add in home exercise to make exercise  part of routine and to increase amount of physical activity.;Long Term: Exercising regularly at least 3-5 days a week.       Increase Strength and Stamina Yes       Intervention Provide advice, education, support and counseling about physical activity/exercise needs.;Develop an individualized exercise prescription for aerobic and resistive training based on initial evaluation findings, risk stratification, comorbidities and participant's personal goals.       Expected Outcomes Short Term: Increase workloads from initial exercise prescription for resistance, speed, and METs.;Short Term: Perform resistance training exercises routinely during rehab and add in resistance training at home;Long Term: Improve cardiorespiratory fitness, muscular endurance and strength as measured by increased METs and functional capacity (6MWT)       Able to understand and use  rate of perceived exertion (RPE) scale Yes       Intervention Provide education and explanation on how to use RPE scale       Expected Outcomes Short Term: Able to use RPE daily in rehab to express subjective intensity level;Long Term:  Able to use RPE to guide intensity level when exercising independently       Able to understand and use Dyspnea scale Yes       Intervention Provide education and explanation on how to use Dyspnea scale       Expected Outcomes Short Term: Able to use Dyspnea scale daily in rehab to express subjective sense of shortness of breath during exertion;Long Term: Able to use Dyspnea scale to guide intensity level when exercising independently       Knowledge and understanding of Target Heart Rate Range (THRR) Yes       Intervention Provide education and explanation of THRR including how the numbers were predicted and where they are located for reference       Expected Outcomes Short Term: Able to state/look up THRR;Long Term: Able to use THRR to govern intensity when exercising independently;Short Term: Able to use daily as guideline for intensity in rehab       Able to check pulse independently Yes       Intervention Review the importance of being able to check your own pulse for safety during independent exercise;Provide education and demonstration on how to check pulse in carotid and radial arteries.       Expected Outcomes Short Term: Able to explain why pulse checking is important during independent exercise;Long Term: Able to check pulse independently and accurately       Understanding of Exercise Prescription Yes       Intervention Provide education, explanation, and written materials on patient's individual exercise prescription       Expected Outcomes Short Term: Able to explain program exercise prescription;Long Term: Able to explain home exercise prescription to exercise independently                Exercise Goals Re-Evaluation :  Exercise Goals  Re-Evaluation     Row Name 12/28/21 1159 12/30/21 1344 01/04/22 1509 01/11/22 1447       Exercise Goal Re-Evaluation   Exercise Goals Review Increase Physical Activity;Increase Strength and Stamina;Understanding of Exercise Prescription Increase Physical Activity;Increase Strength and Stamina;Understanding of Exercise Prescription Increase Physical Activity;Increase Strength and Stamina;Understanding of Exercise Prescription Increase Physical Activity;Increase Strength and Stamina;Understanding of Exercise Prescription    Comments Wille Glaser is doing well for the first couple of sessions that he has been here. His oxygen did drop to 88% but he knows to be aware and to stop and PLB when  he does. He worked at level 3 on the XR and RPE was appropriate. We will continue to monitor as he progresses in the program. For exercise he will use the stepper and recumbent bike at home: he alternates the time from 1.5 hours and 30 minutes alternating days. He uses a pulse ox to monitor his HR and O2; he keeps his HR around 100-110 during exercise, his O2 stays around 92%. Reviewed that his target HR is 95-124. Reviewed home exercise with pt today.  Pt is already exercising on his stepper and recumbent bike for exercise. He exercises about 1 hour and alternates with 30 minutes every other day. We did talk about incorporating structured walking to switch up his routine. He is not interested in taking rest days after discussing the importance. He does home exercise on top of rehab in the same day and made sure he is aware that he is not overworking his muscles and that he is also intaking the appropriate and right amount of foods. He is deffering nutrition at this time. He works up to "amount of calories" and recommended to switch more to base off of RPE and HR. He states he usually reached 100-105 bpm HR during his exercise at home.   He is watching his O2 sats at home as he drops sometime during rehab. Reviewed THR, pulse, RPE,  sign and symptoms, pulse oximetery and when to call 911 or MD. Also discussed weather considerations and indoor options.  Pt voiced understanding. Gabriel Rung is doing well in rehab.  He is up to 5 lb hand weights and 3 METs on the BioStep.  We will continue to monitor his progress.    Expected Outcomes Short: Continue current exercise prescription and watch O2 closely Long: Increase overall MET level Short: Continue current exercise prescription and watch O2 closely Long: Increase overall MET level Short: Watch O2 closely at home, incorporate some walking to switch up routine Long: Exercise independently at home at appropriate prescription Short: Begin to increase workloads Long: Continue to improve stamina             Discharge Exercise Prescription (Final Exercise Prescription Changes):  Exercise Prescription Changes - 01/11/22 1400       Response to Exercise   Blood Pressure (Admit) 124/64    Blood Pressure (Exercise) 118/58    Blood Pressure (Exit) 118/62    Heart Rate (Admit) 92 bpm    Heart Rate (Exercise) 97 bpm    Heart Rate (Exit) 90 bpm    Oxygen Saturation (Admit) 94 %    Oxygen Saturation (Exercise) 89 %    Oxygen Saturation (Exit) 94 %    Rating of Perceived Exertion (Exercise) 12    Perceived Dyspnea (Exercise) 0    Symptoms none    Duration Continue with 30 min of aerobic exercise without signs/symptoms of physical distress.    Intensity THRR unchanged      Progression   Progression Continue to progress workloads to maintain intensity without signs/symptoms of physical distress.    Average METs 2.46      Resistance Training   Training Prescription Yes    Weight 5 lb    Reps 10-15      Interval Training   Interval Training No      Treadmill   MPH 2    Grade 0.5    Minutes 15    METs 2.67      NuStep   Level 4    Minutes 15  METs 2.2      Recumbant Elliptical   Level 1.4    Minutes 15      REL-XR   Level 3    Minutes 15    METs 2      Biostep-RELP    Level 1    Minutes 15    METs 3      Home Exercise Plan   Plans to continue exercise at Home (comment)   recumbent bike, stepper, walking   Frequency Add 3 additional days to program exercise sessions.    Initial Home Exercises Provided 01/04/22      Oxygen   Maintain Oxygen Saturation 88% or higher             Nutrition:  Target Goals: Understanding of nutrition guidelines, daily intake of sodium '1500mg'$ , cholesterol '200mg'$ , calories 30% from fat and 7% or less from saturated fats, daily to have 5 or more servings of fruits and vegetables.  Education: All About Nutrition: -Group instruction provided by verbal, written material, interactive activities, discussions, models, and posters to present general guidelines for heart healthy nutrition including fat, fiber, MyPlate, the role of sodium in heart healthy nutrition, utilization of the nutrition label, and utilization of this knowledge for meal planning. Follow up email sent as well. Written material given at graduation.   Biometrics:  Pre Biometrics - 12/16/21 1708       Pre Biometrics   Height $Remov'5\' 4"'CapfRE$  (1.626 m)    Weight 185 lb 3.2 oz (84 kg)    BMI (Calculated) 31.77    Single Leg Stand 24.3 seconds              Nutrition Therapy Plan and Nutrition Goals:  Nutrition Therapy & Goals - 12/28/21 1502       Nutrition Therapy   RD appointment deferred Yes   Pt would like to defer speaking with RD as he feels he is doing well with his diet. Will continue to follow up.     Personal Nutrition Goals   Nutrition Goal Pt would like to defer speaking with RD as he feels he is doing well with his diet. Will continue to follow up.             Nutrition Assessments:  MEDIFICTS Score Key: ?70 Need to make dietary changes  40-70 Heart Healthy Diet ? 40 Therapeutic Level Cholesterol Diet  Flowsheet Row Pulmonary Rehab from 12/16/2021 in [facility] Cardiac and Pulmonary Rehab  Picture Your Plate Total Score on Admission 68       Picture Your Plate Scores: <76 Unhealthy dietary pattern with much room for improvement. 41-50 Dietary pattern unlikely to meet recommendations for good health and room for improvement. 51-60 More healthful dietary pattern, with some room for improvement.  >60 Healthy dietary pattern, although there may be some specific behaviors that could be improved.   Nutrition Goals Re-Evaluation:   Nutrition Goals Discharge (Final Nutrition Goals Re-Evaluation):   Psychosocial: Target Goals: Acknowledge presence or absence of significant depression and/or stress, maximize coping skills, provide positive support system. Participant is able to verbalize types and ability to use techniques and skills needed for reducing stress and depression.   Education: Stress, Anxiety, and Depression - Group verbal and visual presentation to define topics covered.  Reviews how body is impacted by stress, anxiety, and depression.  Also discusses healthy ways to reduce stress and to treat/manage anxiety and depression.  Written material given at graduation. Flowsheet Row Pulmonary Rehab from 01/13/2022 in [facility]  Cardiac and Pulmonary Rehab  Date 01/06/22  Educator NT  Instruction Review Code 1- [employer] Understanding       Education: Sleep Hygiene -Provides group verbal and written instruction about how sleep can affect your health.  Define sleep hygiene, discuss sleep cycles and impact of sleep habits. Review good sleep hygiene tips.    Initial Review & Psychosocial Screening:  Initial Psych Review & Screening - 12/03/21 1329       Initial Review   Current issues with Current Stress Concerns    Source of Stress Concerns Chronic Illness    Comments He reports feeling stress with his diagnosis. He has called a patient advocate and told them what Dr. Raul Del said about his prognosis and that he didn't feel like he was supported; he didn't have someone on staff to talk to after being given his prognosis.  When he got home he was depressed, lost 15 lbs, and thought he was going to die. He was looking for guidance and didn't feel like he got it - he spoke to patient relations and did not get a response. He reports that he was diagnosed in March and he was the one to ask for a referall to rehab.      Family Dynamics   Good Support System? 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     Barriers   Psychosocial barriers to participate in program The patient should benefit from training in stress management and relaxation.      Screening Interventions   Interventions Encouraged to exercise;Provide feedback about the scores to participant;To provide support and resources with identified psychosocial needs    Expected Outcomes Short Term goal: Utilizing psychosocial counselor, staff and physician to assist with identification of specific Stressors or current issues interfering with healing process. Setting desired goal for each stressor or current issue identified.;Long Term Goal: Stressors or current issues are controlled or eliminated.;Short Term goal: Identification and review with participant of any Quality of Life or Depression concerns found by scoring the questionnaire.;Long Term goal: The participant improves quality of Life and PHQ9 Scores as seen by post scores and/or verbalization of changes             Quality of Life Scores:  Scores of 19 and below usually indicate a poorer quality of life in these areas.  A difference of  2-3 points is a clinically meaningful difference.  A difference of 2-3 points in the total score of the Quality of Life Index has been associated with significant improvement in overall quality of life, self-image, physical symptoms, and general health in studies assessing change in quality of life.  PHQ-9: Review Flowsheet       12/16/2021  Depression screen PHQ 2/9  Decreased Interest 0  Down,  Depressed, Hopeless 0  PHQ - 2 Score 0  Altered sleeping 0  Tired, decreased energy 1  Change in appetite 0  Feeling bad or failure about yourself  0  Trouble concentrating 0  Moving slowly or fidgety/restless 0  Suicidal thoughts 0  PHQ-9 Score 1  Difficult doing work/chores Somewhat difficult   Interpretation of Total Score  Total Score Depression Severity:  1-4 = Minimal depression, 5-9 = Mild depression, 10-14 = Moderate depression, 15-19 = Moderately severe depression, 20-27 = Severe depression   Psychosocial Evaluation and Intervention:  Psychosocial Evaluation - 12/03/21 1433       Psychosocial  Evaluation & Interventions   Interventions Stress management education;Relaxation education;Encouraged to exercise with the program and follow exercise prescription    Comments He reports feeling stress with his diagnosis. He has called a patient advocate and told them what Dr. Raul Del said about his prognosis and that he didn't feel like he was supported; he didn't have someone on staff to talk to after being given his prognosis. When he got home he was depressed, lost 15 lbs, and thought he was going to die. He was looking for guidance and didn't feel like he got it - he spoke to patient relations and did not get a response. He reports that he was diagnosed in March and he was the one to ask for a referall to rehab. 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 He also has two children and 6 grandchildren in [place] who he visits. He has been retired and is still working on relaxing in the afternoon instead of being productive; in the mornings he exercises and plays the piano. For exercise he will use the stepper and recumbent bike at home: he alternates the time from 1.5 hours and 30 minutes alternating days. He uses a pulse ox to monitor his HR and O2; he keeps his HR around 100 during  exercise. He is excited to start Pulmonary Rehab.    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 He also has two children and 6 grandchildren in [place] who he visits. He has been retired and is still working on relaxing in the afternoon instead of being productive; in the mornings he exercises and plays the piano. 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 He feels better about his diagnosis since being on medications and starting rehab as he feels it is helping. he is still upset with how he feels like he was treated when given his diagnosis and would like to go back in person to talk to someone about it. --      Expected Outcomes ST: continue to attend rehab for mental health boost LT: continue to engage in stress reducing activites --      Interventions Encouraged to attend Pulmonary Rehabilitation for the exercise --      Continue Psychosocial Services  Follow up required by staff --               Psychosocial Discharge (Final Psychosocial Re-Evaluation):  Psychosocial Re-Evaluation - 12/30/21 1403       Psychosocial Re-Evaluation   Current issues with --             Education: Education Goals: Education classes  will be provided on a weekly basis, covering required topics. Participant will state understanding/return demonstration of topics  presented.  Learning Barriers/Preferences:   General Pulmonary Education Topics:  Infection Prevention: - Provides verbal and written material to individual with discussion of infection control including proper hand washing and proper equipment cleaning during exercise session. Flowsheet Row Pulmonary Rehab from 01/13/2022 in [facility] Cardiac and Pulmonary Rehab  Education need identified 12/16/21  Date 12/16/21  Educator [place]  Instruction Review Code 1- Verbalizes Understanding       Falls Prevention: - Provides verbal and written material to individual with discussion of falls prevention and safety. Flowsheet Row Pulmonary Rehab from 12/03/2021 in [facility] Cardiac and Pulmonary Rehab  Education need identified 12/03/21  Date 12/03/21  Educator [place]  Instruction Review Code 1- Verbalizes Understanding       Chronic Lung Disease Review: - Group verbal instruction with posters, models, PowerPoint presentations and videos,  to review new updates, new respiratory medications, new advancements in procedures and treatments. Providing information on websites and "800" numbers for continued self-education. Includes information about supplement oxygen, available portable oxygen systems, continuous and intermittent flow rates, oxygen safety, concentrators, and Medicare reimbursement for oxygen. Explanation of Pulmonary Drugs, including class, frequency, complications, importance of spacers, rinsing mouth after steroid MDI's, and proper cleaning methods for nebulizers. Review of basic lung anatomy and physiology related to function, structure, and complications of lung disease. Review of risk factors. Discussion about methods for diagnosing sleep apnea and types of masks and machines for OSA. Includes a review of the use of types of environmental controls: home humidity, furnaces, filters, dust mite/pet prevention, HEPA vacuums. Discussion about weather changes, air quality and the benefits of nasal washing.  Instruction on Warning signs, infection symptoms, calling MD promptly, preventive modes, and value of vaccinations. Review of effective airway clearance, coughing and/or vibration techniques. Emphasizing that all should Create an Action Plan. Written material given at graduation. Flowsheet Row Pulmonary Rehab from 01/13/2022 in [facility] Cardiac and Pulmonary Rehab  Education need identified 12/16/21  Date 12/30/21  Educator [facility]  Instruction Review Code 1- Verbalizes Understanding       AED/CPR: - Group verbal and written instruction with the use of models to demonstrate the basic use of the AED with the basic ABC's of resuscitation.    Anatomy and Cardiac Procedures: - Group verbal and visual presentation and models provide information about basic cardiac anatomy and function. Reviews the testing methods done to diagnose heart disease and the outcomes of the test results. Describes the treatment choices: Medical Management, Angioplasty, or Coronary Bypass Surgery for treating various heart conditions including Myocardial Infarction, Angina, Valve Disease, and Cardiac Arrhythmias.  Written material given at graduation.   Medication Safety: - Group verbal and visual instruction to review commonly prescribed medications for heart and lung disease. Reviews the medication, class of the drug, and side effects. Includes the steps to properly store meds and maintain the prescription regimen.  Written material given at graduation.   Other: -Provides group and verbal instruction on various topics (see comments)   Knowledge Questionnaire Score:  Knowledge Questionnaire Score - 12/16/21 1706       Knowledge Questionnaire Score   Pre Score 13/18              Core Components/Risk Factors/Patient Goals at Admission:  Personal Goals and Risk Factors at Admission - 12/16/21 1722       Core Components/Risk Factors/Patient Goals on Admission    Weight Management Yes;Weight Loss  Intervention  Weight Management: Develop a combined nutrition and exercise program designed to reach desired caloric intake, while maintaining appropriate intake of nutrient and fiber, sodium and fats, and appropriate energy expenditure required for the weight goal.;Weight Management: Provide education and appropriate resources to help participant work on and attain dietary goals.;Weight Management/Obesity: Establish reasonable short term and long term weight goals.    Admit Weight 185 lb (83.9 kg)    Goal Weight: Short Term 180 lb (81.6 kg)    Goal Weight: Long Term 175 lb (79.4 kg)    Expected Outcomes Short Term: Continue to assess and modify interventions until short term weight is achieved;Long Term: Adherence to nutrition and physical activity/exercise program aimed toward attainment of established weight goal;Understanding recommendations for meals to include 15-35% energy as protein, 25-35% energy from fat, 35-60% energy from carbohydrates, less than $RemoveB'200mg'Erjbvfaw$  of dietary cholesterol, 20-35 gm of total fiber daily;Understanding of distribution of calorie intake throughout the day with the consumption of 4-5 meals/snacks;Weight Loss: Understanding of general recommendations for a balanced deficit meal plan, which promotes 1-2 lb weight loss per week and includes a negative energy balance of [phone] kcal/d    Improve shortness of breath with ADL's Yes    Intervention Provide education, individualized exercise plan and daily activity instruction to help decrease symptoms of SOB with activities of daily living.    Expected Outcomes Short Term: Improve cardiorespiratory fitness to achieve a reduction of symptoms when performing ADLs;Long Term: Be able to perform more ADLs without symptoms or delay the onset of symptoms    Increase knowledge of respiratory medications and ability to use respiratory devices properly  Yes    Intervention Provide education and demonstration as needed of appropriate use of medications,  inhalers, and oxygen therapy.    Expected Outcomes Short Term: Achieves understanding of medications use. Understands that oxygen is a medication prescribed by physician. Demonstrates appropriate use of inhaler and oxygen therapy.;Long Term: Maintain appropriate use of medications, inhalers, and oxygen therapy.    Hypertension Yes    Intervention Provide education on lifestyle modifcations including regular physical activity/exercise, weight management, moderate sodium restriction and increased consumption of fresh fruit, vegetables, and low fat dairy, alcohol moderation, and smoking cessation.;Monitor prescription use compliance.    Expected Outcomes Short Term: Continued assessment and intervention until BP is < 140/31mm HG in hypertensive participants. < 130/4mm HG in hypertensive participants with diabetes, heart failure or chronic kidney disease.;Long Term: Maintenance of blood pressure at goal levels.    Lipids Yes    Intervention Provide education and support for participant on nutrition & aerobic/resistive exercise along with prescribed medications to achieve LDL '70mg'$ , HDL >$Remo'40mg'cEoUv$ .    Expected Outcomes Short Term: Participant states understanding of desired cholesterol values and is compliant with medications prescribed. Participant is following exercise prescription and nutrition guidelines.;Long Term: Cholesterol controlled with medications as prescribed, with individualized exercise RX and with personalized nutrition plan. Value goals: LDL < $Rem'70mg'INtx$ , HDL > 40 mg.             Education:Diabetes - Individual verbal and written instruction to review signs/symptoms of diabetes, desired ranges of glucose level fasting, after meals and with exercise. Acknowledge that pre and post exercise glucose checks will be done for 3 sessions at entry of program.   Know Your Numbers and Heart Failure: - Group verbal and visual instruction to discuss disease risk factors for cardiac and pulmonary disease  and treatment options.  Reviews associated critical values for Overweight/Obesity, Hypertension, Cholesterol, and  Diabetes.  Discusses basics of heart failure: signs/symptoms and treatments.  Introduces Heart Failure Zone chart for action plan for heart failure.  Written material given at graduation. Flowsheet Row Pulmonary Rehab from 01/13/2022 in [facility] Cardiac and Pulmonary Rehab  Date 12/23/21  Educator [place]  Instruction Review Code 1- Verbalizes Understanding       Core Components/Risk Factors/Patient Goals Review:   Goals and Risk Factor Review     Row Name 12/30/21 1346             Core Components/Risk Factors/Patient Goals Review   Personal Goals Review Weight Management/Obesity;Hypertension;Improve shortness of breath with ADL's;Lipids       Review Joe feels he is doing well in rehab so far. He continues to take his medications as directed without issues. He doesn't check his BP at home often, but he has a BP cuff; encouraged him to check BP at home at least 1-2x/week. Joe reports his shortness of breath has been doing well, but the heat can make it difficult.       Expected Outcomes ST: check BP at home 1-2x/week  LT: monitor risk factors                Core Components/Risk Factors/Patient Goals at Discharge (Final Review):   Goals and Risk Factor Review - 12/30/21 1346       Core Components/Risk Factors/Patient Goals Review   Personal Goals Review Weight Management/Obesity;Hypertension;Improve shortness of breath with ADL's;Lipids    Review Joe feels he is doing well in rehab so far. He continues to take his medications as directed without issues. He doesn't check his BP at home often, but he has a BP cuff; encouraged him to check BP at home at least 1-2x/week. Joe reports his shortness of breath has been doing well, but the heat can make it difficult.    Expected Outcomes ST: check BP at home 1-2x/week  LT: monitor risk factors             ITP Comments:  ITP  Comments     Row Name 12/03/21 1430 12/16/21 1705 12/23/21 0955 01/20/22 1344     ITP Comments Virtual orientation call completed today. he has an appointment on Date: 12/16/21  for EP eval and gym Orientation.  Documentation of diagnosis can be found in [facility] Date: 10/28/21 . Completed 6MWT and gym orientation. Initial ITP created and sent for review to Dr. Ottie Glazier, Medical Director. 30 Day review completed. Medical Director ITP review done, changes made as directed, and signed approval by Medical Director.    NEW 30 Day review completed. Medical Director ITP review done, changes made as directed, and signed approval by Medical Director.             Comments:

## 2022-01-20 NOTE — Progress Notes (Signed)
Daily Session Note  Patient Details  Name: Tyqwan Pink MRN: [id number] Date of Birth: Oct 27, 1940 Referring Provider:   Flowsheet Row Pulmonary Rehab from 12/16/2021 in [facility] Cardiac and Pulmonary Rehab  Referring Provider Wallene Huh MD       Encounter Date: 01/20/2022  Check In:  Session Check In - 01/20/22 1357       Check-In   Supervising physician immediately available to respond to emergencies See telemetry face sheet for immediately available ER MD    Location [facility]-Cardiac & Pulmonary Rehab    Staff Present Renita Papa, RN BSN;Glendon [place], RCP,RRT,BSRT;Laureen Mingo, [place], RRT, CPFT    Virtual Visit No    Medication changes reported     No    Fall or balance concerns reported    No    Warm-up and Cool-down Performed on first and last piece of equipment    Resistance Training Performed Yes    VAD Patient? No    PAD/SET Patient? No      Pain Assessment   Currently in Pain? No/denies                Social History   Tobacco Use  Smoking Status Former   Packs/day: 1.00   Types: Cigarettes   Quit date: 03/17/1953   Years since quitting: 68.8  Smokeless Tobacco Never    Goals Met:  Independence with exercise equipment Exercise tolerated well No report of concerns or symptoms today Strength training completed today  Goals Unmet:  Not Applicable  Comments: Pt able to follow exercise prescription today without complaint.  Will continue to monitor for progression.    Dr. Emily Filbert is Medical Director for [place].  Dr. Ottie Glazier is Medical Director for [facility] Pulmonary Rehabilitation.

## 2022-02-10 ENCOUNTER — Encounter: Payer: Medicare HMO | Admitting: *Deleted

## 2022-02-10 DIAGNOSIS — J8410 Pulmonary fibrosis, unspecified: Secondary | ICD-10-CM

## 2022-02-10 NOTE — Progress Notes (Signed)
Daily Session Note  Patient Details  Name: Sean Waters MRN: [id number] Date of Birth: 23-Dec-1940 Referring Provider:   Flowsheet Row Pulmonary Rehab from 12/16/2021 in [facility] Cardiac and Pulmonary Rehab  Referring Provider Wallene Huh MD       Encounter Date: 02/10/2022  Check In:  Session Check In - 02/10/22 1420       Check-In   Supervising physician immediately available to respond to emergencies See telemetry face sheet for immediately available ER MD    Location [facility]-Cardiac & Pulmonary Rehab    Staff Present Coralie Keens, MS, ASCM CEP, Exercise Physiologist;Wessley Rosebud Poles, RN, ADN    Virtual Visit No    Medication changes reported     No    Fall or balance concerns reported    No    Warm-up and Cool-down Performed on first and last piece of equipment    Resistance Training Performed Yes    VAD Patient? No    PAD/SET Patient? No      Pain Assessment   Currently in Pain? No/denies                Social History   Tobacco Use  Smoking Status Former   Packs/day: 1.00   Types: Cigarettes   Quit date: 03/17/1953   Years since quitting: 68.9  Smokeless Tobacco Never    Goals Met:  Independence with exercise equipment Exercise tolerated well No report of concerns or symptoms today Strength training completed today  Goals Unmet:  Not Applicable  Comments: Pt able to follow exercise prescription today without complaint.  Will continue to monitor for progression.    Dr. Emily Filbert is Medical Director for [place].  Dr. Ottie Glazier is Medical Director for [facility] Pulmonary Rehabilitation.

## 2022-02-15 ENCOUNTER — Encounter: Payer: Medicare HMO | Attending: Specialist | Admitting: *Deleted

## 2022-02-15 DIAGNOSIS — J8410 Pulmonary fibrosis, unspecified: Secondary | ICD-10-CM | POA: Insufficient documentation

## 2022-02-15 DIAGNOSIS — Z5189 Encounter for other specified aftercare: Secondary | ICD-10-CM | POA: Diagnosis not present

## 2022-02-15 NOTE — Progress Notes (Signed)
Daily Session Note  Patient Details  Name: Bronsyn Shappell MRN: [id number] Date of Birth: May 15, 1941 Referring Provider:   Flowsheet Row Pulmonary Rehab from 12/16/2021 in [facility] Cardiac and Pulmonary Rehab  Referring Provider Wallene Huh MD       Encounter Date: 02/15/2022  Check In:  Session Check In - 02/15/22 1409       Check-In   Supervising physician immediately available to respond to emergencies See telemetry face sheet for immediately available ER MD    Location [facility]-Cardiac & Pulmonary Rehab    Staff Present Justin Mend, Sharren [place], MS, ASCM CEP, Exercise Physiologist;Zakari Couchman Sherryll Burger, RN BSN    Virtual Visit No    Medication changes reported     Yes    Comments decreased prednisone    Fall or balance concerns reported    No    Warm-up and Cool-down Performed on first and last piece of equipment    Resistance Training Performed Yes    VAD Patient? No    PAD/SET Patient? No      Pain Assessment   Currently in Pain? No/denies                Social History   Tobacco Use  Smoking Status Former   Packs/day: 1.00   Types: Cigarettes   Quit date: 03/17/1953   Years since quitting: 68.9  Smokeless Tobacco Never    Goals Met:  Independence with exercise equipment Exercise tolerated well No report of concerns or symptoms today Strength training completed today  Goals Unmet:  Not Applicable  Comments: Pt able to follow exercise prescription today without complaint.  Will continue to monitor for progression.    Dr. Emily Filbert is Medical Director for [place].  Dr. Ottie Glazier is Medical Director for [facility] Pulmonary Rehabilitation.

## 2022-02-17 ENCOUNTER — Encounter: Payer: Medicare HMO | Admitting: *Deleted

## 2022-02-17 ENCOUNTER — Encounter: Payer: Self-pay | Admitting: *Deleted

## 2022-02-17 DIAGNOSIS — J8410 Pulmonary fibrosis, unspecified: Secondary | ICD-10-CM

## 2022-02-17 NOTE — Progress Notes (Signed)
Pulmonary Individual Treatment Plan  Patient Details  Name: Zahi Plaskett MRN: [id number] Date of Birth: 02/10/1941 Referring Provider:   Flowsheet Row Pulmonary Rehab from 12/16/2021 in [facility] Cardiac and Pulmonary Rehab  Referring Provider Wallene Huh MD       Initial Encounter Date:  Flowsheet Row Pulmonary Rehab from 12/16/2021 in [facility] Cardiac and Pulmonary Rehab  Date 12/16/21       Visit Diagnosis: Pulmonary fibrosis ([place])  Patient's Home Medications on Admission:  Current Outpatient Medications:    albuterol (VENTOLIN HFA) 108 (90 Base) MCG/ACT inhaler, Inhale 2 puffs into the lungs every 6 (six) hours as needed., Disp: , Rfl:    ALPRAZolam (XANAX) 0.25 MG tablet, Take 0.25 mg by mouth as needed for anxiety., Disp: , Rfl:    enalapril (VASOTEC) 10 MG tablet, Take 1.5 tablets by mouth daily., Disp: , Rfl:    ezetimibe-simvastatin (VYTORIN) 10-10 MG tablet, Take 1 tablet by mouth at bedtime., Disp: , Rfl:    levothyroxine (SYNTHROID) 50 MCG tablet, Take 50 mcg by mouth daily., Disp: , Rfl:    predniSONE (DELTASONE) 10 MG tablet, Take 1 tablet by mouth daily., Disp: , Rfl:    predniSONE (DELTASONE) 5 MG tablet, Take by mouth., Disp: , Rfl:    tadalafil (CIALIS) 5 MG tablet, Take by mouth., Disp: , Rfl:   Past Medical History: No past medical history on file.  Tobacco Use: Social History   Tobacco Use  Smoking Status Former   Packs/day: 1.00   Types: Cigarettes   Quit date: 03/17/1953   Years since quitting: 68.9  Smokeless Tobacco Never    Labs: Review Flowsheet        No data to display           Pulmonary Assessment Scores:  Pulmonary Assessment Scores     Row Name 12/16/21 1707         ADL UCSD   ADL Phase Entry     SOB Score total 17     Rest 0     Walk 1     Stairs 1     Bath 0     Dress 0     Shop 1       CAT Score   CAT Score 4       mMRC Score   mMRC Score 1              UCSD: Self-administered rating of dyspnea  associated with activities of daily living (ADLs) 6-point scale (0 = "not at all" to 5 = "maximal or unable to do because of breathlessness")  Scoring Scores range from 0 to 120.  Minimally important difference is 5 units  CAT: CAT can identify the health impairment of COPD patients and is better correlated with disease progression.  CAT has a scoring range of zero to 40. The CAT score is classified into four groups of low (less than 10), medium (10 - 20), high (21-30) and very high (31-40) based on the impact level of disease on health status. A CAT score over 10 suggests significant symptoms.  A worsening CAT score could be explained by an exacerbation, poor medication adherence, poor inhaler technique, or progression of COPD or comorbid conditions.  CAT MCID is 2 points  mMRC: mMRC (Modified Medical Research Council) Dyspnea Scale is used to assess the degree of baseline functional disability in patients of respiratory disease due to dyspnea. No minimal important difference is established. A decrease in score of 1 point  or greater is considered a positive change.   Pulmonary Function Assessment:   Exercise Target Goals: Exercise Program Goal: Individual exercise prescription set using results from initial 6 min walk test and THRR while considering  patient's activity barriers and safety.   Exercise Prescription Goal: Initial exercise prescription builds to 30-45 minutes a day of aerobic activity, 2-3 days per week.  Home exercise guidelines will be given to patient during program as part of exercise prescription that the participant will acknowledge.  Education: Aerobic Exercise: - Group verbal and visual presentation on the components of exercise prescription. Introduces F.I.T.T principle from ACSM for exercise prescriptions.  Reviews F.I.T.T. principles of aerobic exercise including progression. Written material given at graduation. Flowsheet Row Pulmonary Rehab from 01/13/2022 in [facility]  Cardiac and Pulmonary Rehab  Education need identified 12/16/21       Education: Resistance Exercise: - Group verbal and visual presentation on the components of exercise prescription. Introduces F.I.T.T principle from ACSM for exercise prescriptions  Reviews F.I.T.T. principles of resistance exercise including progression. Written material given at graduation.    Education: Exercise & Equipment Safety: - Individual verbal instruction and demonstration of equipment use and safety with use of the equipment. Flowsheet Row Pulmonary Rehab from 01/13/2022 in [facility] Cardiac and Pulmonary Rehab  Education need identified 12/16/21  Date 12/16/21  Educator [place]  Instruction Review Code 1- Verbalizes Understanding       Education: Exercise Physiology & General Exercise Guidelines: - Group verbal and written instruction with models to review the exercise physiology of the cardiovascular system and associated critical values. Provides general exercise guidelines with specific guidelines to those with heart or lung disease.  Flowsheet Row Pulmonary Rehab from 01/13/2022 in [facility] Cardiac and Pulmonary Rehab  Date 01/13/22  Educator kl  Instruction Review Code 1- [employer] Understanding       Education: Flexibility, Balance, Mind/Body Relaxation: - Group verbal and visual presentation with interactive activity on the components of exercise prescription. Introduces F.I.T.T principle from ACSM for exercise prescriptions. Reviews F.I.T.T. principles of flexibility and balance exercise training including progression. Also discusses the mind body connection.  Reviews various relaxation techniques to help reduce and manage stress (i.e. Deep breathing, progressive muscle relaxation, and visualization). Balance handout provided to take home. Written material given at graduation.   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 HR 105 bpm     Max Ex. BP 152/72     2  Minute Post BP 132/76       Interval HR   1 Minute HR 79     2 Minute HR 94     3 Minute HR 105     4 Minute HR 105     5 Minute HR 100     6 Minute HR 105     2 Minute Post HR 70     Interval Heart Rate? Yes       Interval Oxygen   Interval Oxygen? Yes     Baseline Oxygen Saturation % 95 %     1 Minute Oxygen Saturation % 92 %     1 Minute Liters of Oxygen 0 L  RA     2 Minute Oxygen Saturation % 90 %     2 Minute Liters of Oxygen 0 L     3 Minute Oxygen Saturation % 88 %     3 Minute Liters of Oxygen 0 L     4 Minute Oxygen Saturation % 89 %     4 Minute Liters of Oxygen 0 L     5 Minute Oxygen Saturation % 88 %     5 Minute Liters of Oxygen 0 L     6 Minute Oxygen Saturation % 87 %     6 Minute Liters of Oxygen 0 L     2 Minute Post Oxygen Saturation % 96 %     2 Minute Post Liters of Oxygen 0 L             Oxygen Initial Assessment:  Oxygen Initial Assessment - 12/16/21 1707       Home Oxygen   Home Oxygen Device None    Sleep Oxygen Prescription CPAP    Liters per minute 5    Home Exercise Oxygen Prescription None    Home Resting Oxygen Prescription None    Compliance with Home Oxygen Use No      Initial 6 min Walk   Oxygen Used None      Program Oxygen Prescription   Program Oxygen Prescription None      Intervention   Short Term Goals To learn and understand  importance of monitoring SPO2 with pulse oximeter and demonstrate accurate use of the pulse oximeter.;To learn and understand importance of maintaining oxygen saturations>88%;To learn and demonstrate proper pursed lip breathing techniques or other breathing techniques. ;To learn and demonstrate proper use of respiratory medications    Long  Term Goals Compliance with respiratory medication;Exhibits proper breathing techniques, such as pursed lip breathing or other method taught during program session;Maintenance of O2 saturations>88%;Verbalizes importance of monitoring SPO2 with pulse oximeter and return demonstration             Oxygen Re-Evaluation:  Oxygen Re-Evaluation     Row Name 12/30/21 1353 02/15/22 1426           Program Oxygen Prescription   Program Oxygen Prescription None None        Home Oxygen   Home Oxygen Device None None      Sleep Oxygen Prescription CPAP CPAP      Liters per minute 5 5      Home Exercise Oxygen Prescription None None      Home Resting Oxygen Prescription None None      Compliance with Home Oxygen Use Yes Yes        Goals/Expected Outcomes   Short Term Goals To learn and understand importance of monitoring SPO2 with pulse oximeter  and demonstrate accurate use of the pulse oximeter.;To learn and understand importance of maintaining oxygen saturations>88%;To learn and demonstrate proper pursed lip breathing techniques or other breathing techniques. ;To learn and demonstrate proper use of respiratory medications To learn and understand importance of monitoring SPO2 with pulse oximeter and demonstrate accurate use of the pulse oximeter.;To learn and understand importance of maintaining oxygen saturations>88%;To learn and demonstrate proper pursed lip breathing techniques or other breathing techniques. ;To learn and demonstrate proper use of respiratory medications      Long  Term Goals Compliance with respiratory medication;Exhibits proper breathing  techniques, such as pursed lip breathing or other method taught during program session;Maintenance of O2 saturations>88%;Verbalizes importance of monitoring SPO2 with pulse oximeter and return demonstration Compliance with respiratory medication;Exhibits proper breathing techniques, such as pursed lip breathing or other method taught during program session;Maintenance of O2 saturations>88%;Verbalizes importance of monitoring SPO2 with pulse oximeter and return demonstration      Comments -- Wille Glaser is good about using his CPAP each night.  He sleeps better with it.  He is doing well on his meds.  His saturations continue to do well.  We will conitnue to check in with him.      Goals/Expected Outcomes -- Short: Conitnue to use meds and CPAP daily Long: conitnue to monitor saturations               Oxygen Discharge (Final Oxygen Re-Evaluation):  Oxygen Re-Evaluation - 02/15/22 1426       Program Oxygen Prescription   Program Oxygen Prescription None      Home Oxygen   Home Oxygen Device None    Sleep Oxygen Prescription CPAP    Liters per minute 5    Home Exercise Oxygen Prescription None    Home Resting Oxygen Prescription None    Compliance with Home Oxygen Use Yes      Goals/Expected Outcomes   Short Term Goals To learn and understand importance of monitoring SPO2 with pulse oximeter and demonstrate accurate use of the pulse oximeter.;To learn and understand importance of maintaining oxygen saturations>88%;To learn and demonstrate proper pursed lip breathing techniques or other breathing techniques. ;To learn and demonstrate proper use of respiratory medications    Long  Term Goals Compliance with respiratory medication;Exhibits proper breathing techniques, such as pursed lip breathing or other method taught during program session;Maintenance of O2 saturations>88%;Verbalizes importance of monitoring SPO2 with pulse oximeter and return demonstration    Comments Joe is good about using his  CPAP each night.  He sleeps better with it.  He is doing well on his meds.  His saturations continue to do well.  We will conitnue to check in with him.    Goals/Expected Outcomes Short: Conitnue to use meds and CPAP daily Long: conitnue to monitor saturations             Initial Exercise Prescription:  Initial Exercise Prescription - 12/16/21 1700       Date of Initial Exercise RX and Referring Provider   Date 12/16/21    Referring Provider Wallene Huh MD      Oxygen   Maintain Oxygen Saturation 88% or higher      Treadmill   MPH 2    Grade 0.5    Minutes 15    METs 2.53      Recumbant Elliptical   Level 1    Watts 50    Minutes 15    METs 1.9      REL-XR  Level 1    Speed 50    Minutes 15    METs 1.9      Prescription Details   Frequency (times per week) 2    Duration Progress to 30 minutes of continuous aerobic without signs/symptoms of physical distress      Intensity   THRR 40-80% of Max Heartrate 95 - 124    Ratings of Perceived Exertion 11-13    Perceived Dyspnea 0-4      Progression   Progression Continue to progress workloads to maintain intensity without signs/symptoms of physical distress.      Resistance Training   Training Prescription Yes    Weight 4 lb    Reps 10-15             Perform Capillary Blood Glucose checks as needed.  Exercise Prescription Changes:   Exercise Prescription Changes     Row Name 12/16/21 1700 12/28/21 1100 01/04/22 1500 01/11/22 1400 01/27/22 0900     Response to Exercise   Blood Pressure (Admit) 140/74 106/64 -- 124/64 138/66   Blood Pressure (Exercise) 166/74 148/68 -- 118/58 144/62   Blood Pressure (Exit) 132/76 120/70 -- 118/62 110/60   Heart Rate (Admit) 66 bpm 75 bpm -- 92 bpm 100 bpm   Heart Rate (Exercise) 105 bpm 109 bpm -- 97 bpm 106 bpm   Heart Rate (Exit) 70 bpm 89 bpm -- 90 bpm 81 bpm   Oxygen Saturation (Admit) 95 % 94 % -- 94 % 93 %   Oxygen Saturation (Exercise) 88 % 88 % -- 89 %  90 %   Oxygen Saturation (Exit) 96 % 91 % -- 94 % 91 %   Rating of Perceived Exertion (Exercise) 11 11 -- 12 11   Perceived Dyspnea (Exercise) 0 -- -- 0 0   Symptoms none none -- none none   Comments 6MWT results 2nd full day of exercise -- -- --   Duration -- Progress to 30 minutes of  aerobic without signs/symptoms of physical distress -- Continue with 30 min of aerobic exercise without signs/symptoms of physical distress. Continue with 30 min of aerobic exercise without signs/symptoms of physical distress.   Intensity -- THRR unchanged -- THRR unchanged THRR unchanged     Progression   Progression -- Continue to progress workloads to maintain intensity without signs/symptoms of physical distress. -- Continue to progress workloads to maintain intensity without signs/symptoms of physical distress. Continue to progress workloads to maintain intensity without signs/symptoms of physical distress.   Average METs -- 2.39 -- 2.46 2.6     Resistance Training   Training Prescription -- Yes -- Yes Yes   Weight -- 4 lb -- 5 lb 5 lb   Reps -- 10-15 -- 10-15 10-15     Interval Training   Interval Training -- No -- No No     Treadmill   MPH -- 2 -- 2 --   Grade -- 0.5 -- 0.5 --   Minutes -- 15 -- 15 --   METs -- 2.67 -- 2.67 --     Recumbant Bike   Level -- -- -- -- 3.5   Minutes -- -- -- -- 15   METs -- -- -- -- 3.3     NuStep   Level -- -- -- 4 6   Minutes -- -- -- 15 15   METs -- -- -- 2.2 2.2     Recumbant Elliptical   Level -- 1.4 -- 1.4 --   Minutes --  15 -- 15 --     REL-XR   Level -- 3 -- 3 --   Minutes -- 15 -- 15 --   METs -- 2.2 -- 2 --     Biostep-RELP   Level -- -- -- 1 --   Minutes -- -- -- 15 --   METs -- -- -- 3 --     Home Exercise Plan   Plans to continue exercise at -- -- Home (comment)  recumbent bike, stepper, walking Home (comment)  recumbent bike, stepper, walking Home (comment)  recumbent bike, stepper, walking   Frequency -- -- Add 3 additional days  to program exercise sessions. Add 3 additional days to program exercise sessions. Add 3 additional days to program exercise sessions.   Initial Home Exercises Provided -- -- 01/04/22 01/04/22 01/04/22     Oxygen   Maintain Oxygen Saturation -- 88% or higher 88% or higher 88% or higher 88% or higher            Exercise Comments:   Exercise Goals and Review:   Exercise Goals     Row Name 12/16/21 1720             Exercise Goals   Increase Physical Activity Yes       Intervention Provide advice, education, support and counseling about physical activity/exercise needs.;Develop an individualized exercise prescription for aerobic and resistive training based on initial evaluation findings, risk stratification, comorbidities and participant's personal goals.       Expected Outcomes Short Term: Attend rehab on a regular basis to increase amount of physical activity.;Long Term: Add in home exercise to make exercise part of routine and to increase amount of physical activity.;Long Term: Exercising regularly at least 3-5 days a week.       Increase Strength and Stamina Yes       Intervention Provide advice, education, support and counseling about physical activity/exercise needs.;Develop an individualized exercise prescription for aerobic and resistive training based on initial evaluation findings, risk stratification, comorbidities and participant's personal goals.       Expected Outcomes Short Term: Increase workloads from initial exercise prescription for resistance, speed, and METs.;Short Term: Perform resistance training exercises routinely during rehab and add in resistance training at home;Long Term: Improve cardiorespiratory fitness, muscular endurance and strength as measured by increased METs and functional capacity (6MWT)       Able to understand and use rate of perceived exertion (RPE) scale Yes       Intervention Provide education and explanation on how to use RPE scale        Expected Outcomes Short Term: Able to use RPE daily in rehab to express subjective intensity level;Long Term:  Able to use RPE to guide intensity level when exercising independently       Able to understand and use Dyspnea scale Yes       Intervention Provide education and explanation on how to use Dyspnea scale       Expected Outcomes Short Term: Able to use Dyspnea scale daily in rehab to express subjective sense of shortness of breath during exertion;Long Term: Able to use Dyspnea scale to guide intensity level when exercising independently       Knowledge and understanding of Target Heart Rate Range (THRR) Yes       Intervention Provide education and explanation of THRR including how the numbers were predicted and where they are located for reference       Expected Outcomes Short Term: Able to  state/look up THRR;Long Term: Able to use THRR to govern intensity when exercising independently;Short Term: Able to use daily as guideline for intensity in rehab       Able to check pulse independently Yes       Intervention Review the importance of being able to check your own pulse for safety during independent exercise;Provide education and demonstration on how to check pulse in carotid and radial arteries.       Expected Outcomes Short Term: Able to explain why pulse checking is important during independent exercise;Long Term: Able to check pulse independently and accurately       Understanding of Exercise Prescription Yes       Intervention Provide education, explanation, and written materials on patient's individual exercise prescription       Expected Outcomes Short Term: Able to explain program exercise prescription;Long Term: Able to explain home exercise prescription to exercise independently                Exercise Goals Re-Evaluation :  Exercise Goals Re-Evaluation     Row Name 12/28/21 1159 12/30/21 1344 01/04/22 1509 01/11/22 1447 01/27/22 0950     Exercise Goal Re-Evaluation    Exercise Goals Review Increase Physical Activity;Increase Strength and Stamina;Understanding of Exercise Prescription Increase Physical Activity;Increase Strength and Stamina;Understanding of Exercise Prescription Increase Physical Activity;Increase Strength and Stamina;Understanding of Exercise Prescription Increase Physical Activity;Increase Strength and Stamina;Understanding of Exercise Prescription Increase Physical Activity;Increase Strength and Stamina;Understanding of Exercise Prescription   Comments Wille Glaser is doing well for the first couple of sessions that he has been here. His oxygen did drop to 88% but he knows to be aware and to stop and PLB when he does. He worked at level 3 on the XR and RPE was appropriate. We will continue to monitor as he progresses in the program. For exercise he will use the stepper and recumbent bike at home: he alternates the time from 1.5 hours and 30 minutes alternating days. He uses a pulse ox to monitor his HR and O2; he keeps his HR around 100-110 during exercise, his O2 stays around 92%. Reviewed that his target HR is 95-124. Reviewed home exercise with pt today.  Pt is already exercising on his stepper and recumbent bike for exercise. He exercises about 1 hour and alternates with 30 minutes every other day. We did talk about incorporating structured walking to switch up his routine. He is not interested in taking rest days after discussing the importance. He does home exercise on top of rehab in the same day and made sure he is aware that he is not overworking his muscles and that he is also intaking the appropriate and right amount of foods. He is deffering nutrition at this time. He works up to "amount of calories" and recommended to switch more to base off of RPE and HR. He states he usually reached 100-105 bpm HR during his exercise at home.   He is watching his O2 sats at home as he drops sometime during rehab. Reviewed THR, pulse, RPE, sign and symptoms, pulse  oximetery and when to call 911 or MD. Also discussed weather considerations and indoor options.  Pt voiced understanding. Wille Glaser is doing well in rehab.  He is up to 5 lb hand weights and 3 METs on the BioStep.  We will continue to monitor his progress. Wille Glaser is doing well in rehab. He improved to level 6 on the T4. He also began using the recumbent bike and did well  with level 3.5. He also increased his overall average MET levelt o 2.6 METs. We will continue to monitor his progress in the program.   Expected Outcomes Short: Continue current exercise prescription and watch O2 closely Long: Increase overall MET level Short: Continue current exercise prescription and watch O2 closely Long: Increase overall MET level Short: Watch O2 closely at home, incorporate some walking to switch up routine Long: Exercise independently at home at appropriate prescription Short: Begin to increase workloads Long: Continue to improve stamina Short: Continue to increase workloads Long: Continue to improve strength and stamina    Row Name 02/09/22 1547 02/15/22 1415           Exercise Goal Re-Evaluation   Exercise Goals Review Increase Physical Activity;Increase Strength and Stamina;Understanding of Exercise Prescription Increase Physical Activity;Increase Strength and Stamina;Understanding of Exercise Prescription      Comments Wille Glaser has not been here since last reivew as he has been out of town. We hope to see his attendance improve as returns back on 9/27. Joe returned today.  He is feeling good with his exercise. He is walking and building a wall in his bathroom on his off days to keep up with his exercise.  He does feel like his strength and stamina are starting to recover.      Expected Outcomes Short: Maintain routine attendance Long: Finish [facility] Program Short: Continue to exericse on off days Long: conitnue to improve stamina               Discharge Exercise Prescription (Final Exercise Prescription Changes):   Exercise Prescription Changes - 01/27/22 0900       Response to Exercise   Blood Pressure (Admit) 138/66    Blood Pressure (Exercise) 144/62    Blood Pressure (Exit) 110/60    Heart Rate (Admit) 100 bpm    Heart Rate (Exercise) 106 bpm    Heart Rate (Exit) 81 bpm    Oxygen Saturation (Admit) 93 %    Oxygen Saturation (Exercise) 90 %    Oxygen Saturation (Exit) 91 %    Rating of Perceived Exertion (Exercise) 11    Perceived Dyspnea (Exercise) 0    Symptoms none    Duration Continue with 30 min of aerobic exercise without signs/symptoms of physical distress.    Intensity THRR unchanged      Progression   Progression Continue to progress workloads to maintain intensity without signs/symptoms of physical distress.    Average METs 2.6      Resistance Training   Training Prescription Yes    Weight 5 lb    Reps 10-15      Interval Training   Interval Training No      Recumbant Bike   Level 3.5    Minutes 15    METs 3.3      NuStep   Level 6    Minutes 15    METs 2.2      Home Exercise Plan   Plans to continue exercise at Home (comment)   recumbent bike, stepper, walking   Frequency Add 3 additional days to program exercise sessions.    Initial Home Exercises Provided 01/04/22      Oxygen   Maintain Oxygen Saturation 88% or higher             Nutrition:  Target Goals: Understanding of nutrition guidelines, daily intake of sodium <1540m, cholesterol <2067m calories 30% from fat and 7% or less from saturated fats, daily to have 5 or more  servings of fruits and vegetables.  Education: All About Nutrition: -Group instruction provided by verbal, written material, interactive activities, discussions, models, and posters to present general guidelines for heart healthy nutrition including fat, fiber, MyPlate, the role of sodium in heart healthy nutrition, utilization of the nutrition label, and utilization of this knowledge for meal planning. Follow up email sent as well.  Written material given at graduation.   Biometrics:  Pre Biometrics - 12/16/21 1708       Pre Biometrics   Height _0  (1.626 m)    Weight 185 lb 3.2 oz (84 kg)    BMI (Calculated) 31.77    Single Leg Stand 24.3 seconds              Nutrition Therapy Plan and Nutrition Goals:  Nutrition Therapy & Goals - 02/15/22 1418       Nutrition Therapy   RD appointment deferred Yes             Nutrition Assessments:  MEDIFICTS Score Key: ?70 Need to make dietary changes  40-70 Heart Healthy Diet ? 40 Therapeutic Level Cholesterol Diet  Flowsheet Row Pulmonary Rehab from 12/16/2021 in [facility] Cardiac and Pulmonary Rehab  Picture Your Plate Total Score on Admission 68      Picture Your Plate Scores: <51 Unhealthy dietary pattern with much room for improvement. 41-50 Dietary pattern unlikely to meet recommendations for good health and room for improvement. 51-60 More healthful dietary pattern, with some room for improvement.  >60 Healthy dietary pattern, although there may be some specific behaviors that could be improved.   Nutrition Goals Re-Evaluation:  Nutrition Goals Re-Evaluation     [place] Name 02/15/22 1419             Goals   Nutrition Goal Continues to defer appointments       Comment Joe is feeling good about his diet.       Expected Outcome Continue to focus on healthy eating.                Nutrition Goals Discharge (Final Nutrition Goals Re-Evaluation):  Nutrition Goals Re-Evaluation - 02/15/22 1419       Goals   Nutrition Goal Continues to defer appointments    Comment Joe is feeling good about his diet.    Expected Outcome Continue to focus on healthy eating.             Psychosocial: Target Goals: Acknowledge presence or absence of significant depression and/or stress, maximize coping skills, provide positive support system. Participant is able to verbalize types and ability to use techniques and skills needed for reducing stress and  depression.   Education: Stress, Anxiety, and Depression - Group verbal and visual presentation to define topics covered.  Reviews how body is impacted by stress, anxiety, and depression.  Also discusses healthy ways to reduce stress and to treat/manage anxiety and depression.  Written material given at graduation. Flowsheet Row Pulmonary Rehab from 01/13/2022 in [facility] Cardiac and Pulmonary Rehab  Date 01/06/22  Educator NT  Instruction Review Code 1- [employer] Understanding       Education: Sleep Hygiene -Provides group verbal and written instruction about how sleep can affect your health.  Define sleep hygiene, discuss sleep cycles and impact of sleep habits. Review good sleep hygiene tips.    Initial Review & Psychosocial Screening:  Initial Psych Review & Screening - 12/03/21 1329       Initial Review   Current issues with Current Stress Concerns  Source of Stress Concerns Chronic Illness    Comments He reports feeling stress with his diagnosis. He has called a patient advocate and told them what Dr. Raul Del said about his prognosis and that he didn't feel like he was supported; he didn't have someone on staff to talk to after being given his prognosis. When he got home he was depressed, lost 15 lbs, and thought he was going to die. He was looking for guidance and didn't feel like he got it - he spoke to patient relations and did not get a response. He reports that he was diagnosed in March and he was the one to ask for a referall to rehab.      Family Dynamics   Good Support System? 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     Barriers   Psychosocial barriers to participate in program The patient should benefit from training in stress management and relaxation.      Screening Interventions   Interventions Encouraged to exercise;Provide feedback about the scores to participant;To provide support and resources with  identified psychosocial needs    Expected Outcomes Short Term goal: Utilizing psychosocial counselor, staff and physician to assist with identification of specific Stressors or current issues interfering with healing process. Setting desired goal for each stressor or current issue identified.;Long Term Goal: Stressors or current issues are controlled or eliminated.;Short Term goal: Identification and review with participant of any Quality of Life or Depression concerns found by scoring the questionnaire.;Long Term goal: The participant improves quality of Life and PHQ9 Scores as seen by post scores and/or verbalization of changes             Quality of Life Scores:  Scores of 19 and below usually indicate a poorer quality of life in these areas.  A difference of  2-3 points is a clinically meaningful difference.  A difference of 2-3 points in the total score of the Quality of Life Index has been associated with significant improvement in overall quality of life, self-image, physical symptoms, and general health in studies assessing change in quality of life.  PHQ-9: Review Flowsheet       12/16/2021  Depression screen PHQ 2/9  Decreased Interest 0  Down, Depressed, Hopeless 0  PHQ - 2 Score 0  Altered sleeping 0  Tired, decreased energy 1  Change in appetite 0  Feeling bad or failure about yourself  0  Trouble concentrating 0  Moving slowly or fidgety/restless 0  Suicidal thoughts 0  PHQ-9 Score 1  Difficult doing work/chores Somewhat difficult   Interpretation of Total Score  Total Score Depression Severity:  1-4 = Minimal depression, 5-9 = Mild depression, 10-14 = Moderate depression, 15-19 = Moderately severe depression, 20-27 = Severe depression   Psychosocial Evaluation and Intervention:  Psychosocial Evaluation - 12/03/21 1433       Psychosocial Evaluation & Interventions   Interventions Stress management education;Relaxation education;Encouraged to exercise with the  program and follow exercise prescription    Comments He reports feeling stress with his diagnosis. He has called a patient advocate and told them what Dr. Raul Del said about his prognosis and that he didn't feel like he was supported; he didn't have someone on staff to talk to after being given his prognosis. When he got home he was depressed, lost 15 lbs, and thought he was going to die. He was looking for guidance and  didn't feel like he got it - he spoke to patient relations and did not get a response. He reports that he was diagnosed in March and he was the one to ask for a referall to rehab. 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 He also has two children and 6 grandchildren in [place] who he visits. He has been retired and is still working on relaxing in the afternoon instead of being productive; in the mornings he exercises and plays the piano. For exercise he will use the stepper and recumbent bike at home: he alternates the time from 1.5 hours and 30 minutes alternating days. He uses a pulse ox to monitor his HR and O2; he keeps his HR around 100 during exercise. He is excited to start Pulmonary Rehab.    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 He also has two children and 6  grandchildren in [place] who he visits. He has been retired and is still working on relaxing in the afternoon instead of being productive; in the mornings he exercises and plays the piano. 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 He feels better about his diagnosis since being on medications and starting rehab as he feels it is helping. he is still upset with how he feels like he was treated when given his diagnosis and would like to go back in person to talk to someone about it. -- Wille Glaser is doing well in rehab.  He was out for a bit, but now doing well.  He feels good overall.  He is sleeping good for most part.  He is good about using his CPAP each night.  He will get up once, with one trip to bathroom.  He is usually able to get back to sleep.  He is still playing the piano as an outlet.     Expected Outcomes ST: continue to attend rehab for mental health boost LT: continue to engage in stress reducing activites -- Short: Continue to use piano as a stress release Long: continue to stay positive     Interventions Encouraged to attend Pulmonary Rehabilitation for the exercise -- Encouraged to attend Pulmonary Rehabilitation for the exercise     Continue Psychosocial Services  Follow up required by staff -- --              Psychosocial Discharge (Final Psychosocial Re-Evaluation):  Psychosocial Re-Evaluation - 02/15/22 1416       Psychosocial Re-Evaluation   Current issues with Current Stress Concerns    Comments Wille Glaser is doing well in rehab.  He was out for  a bit, but now doing well.  He feels good overall.  He is sleeping good for most part.  He is good about using his CPAP each night.  He will get up once, with one trip to bathroom.  He is usually able to get back to sleep.  He is still playing the piano as an outlet.    Expected Outcomes Short: Continue to use piano as a stress release Long: continue to stay positive     Interventions Encouraged to attend Pulmonary Rehabilitation for the exercise             Education: Education Goals: Education classes will be provided on a weekly basis, covering required topics. Participant will state understanding/return demonstration of topics presented.  Learning Barriers/Preferences:   General Pulmonary Education Topics:  Infection Prevention: - Provides verbal and written material to individual with discussion of infection control including proper hand washing and proper equipment cleaning during exercise session. Flowsheet Row Pulmonary Rehab from 01/13/2022 in [facility] Cardiac and Pulmonary Rehab  Education need identified 12/16/21  Date 12/16/21  Educator [place]  Instruction Review Code 1- Verbalizes Understanding       Falls Prevention: - Provides verbal and written material to individual with discussion of falls prevention and safety. Flowsheet Row Pulmonary Rehab from 12/03/2021 in [facility] Cardiac and Pulmonary Rehab  Education need identified 12/03/21  Date 12/03/21  Educator [place]  Instruction Review Code 1- Verbalizes Understanding       Chronic Lung Disease Review: - Group verbal instruction with posters, models, PowerPoint presentations and videos,  to review new updates, new respiratory medications, new advancements in procedures and treatments. Providing information on websites and "800" numbers for continued self-education. Includes information about supplement oxygen, available portable oxygen systems, continuous and intermittent flow rates, oxygen safety, concentrators, and Medicare reimbursement for oxygen. Explanation of Pulmonary Drugs, including class, frequency, complications, importance of spacers, rinsing mouth after steroid MDI's, and proper cleaning methods for nebulizers. Review of basic lung anatomy and physiology related to function, structure, and complications of lung disease. Review of risk factors. Discussion about methods for  diagnosing sleep apnea and types of masks and machines for OSA. Includes a review of the use of types of environmental controls: home humidity, furnaces, filters, dust mite/pet prevention, HEPA vacuums. Discussion about weather changes, air quality and the benefits of nasal washing. Instruction on Warning signs, infection symptoms, calling MD promptly, preventive modes, and value of vaccinations. Review of effective airway clearance, coughing and/or vibration techniques. Emphasizing that all should Create an Action Plan. Written material given at graduation. Flowsheet Row Pulmonary Rehab from 01/13/2022 in [facility] Cardiac and Pulmonary Rehab  Education need identified 12/16/21  Date 12/30/21  Educator [facility]  Instruction Review Code 1- Verbalizes Understanding       AED/CPR: - Group verbal and written instruction with the use of models to demonstrate the basic use of the AED with the basic ABC's of resuscitation.    Anatomy and Cardiac Procedures: - Group verbal and visual presentation and models provide information about basic cardiac anatomy and function. Reviews the testing methods done to diagnose heart disease and the outcomes of the test results. Describes the treatment choices: Medical Management, Angioplasty, or Coronary Bypass Surgery for treating various heart conditions including Myocardial Infarction, Angina, Valve Disease, and Cardiac Arrhythmias.  Written material given at graduation.   Medication Safety: - Group verbal and visual instruction to review commonly prescribed medications for heart and lung disease. Reviews the medication, class of the drug, and  side effects. Includes the steps to properly store meds and maintain the prescription regimen.  Written material given at graduation.   Other: -Provides group and verbal instruction on various topics (see comments)   Knowledge Questionnaire Score:  Knowledge Questionnaire Score - 12/16/21 1706       Knowledge Questionnaire  Score   Pre Score 13/18              Core Components/Risk Factors/Patient Goals at Admission:  Personal Goals and Risk Factors at Admission - 12/16/21 1722       Core Components/Risk Factors/Patient Goals on Admission    Weight Management Yes;Weight Loss    Intervention Weight Management: Develop a combined nutrition and exercise program designed to reach desired caloric intake, while maintaining appropriate intake of nutrient and fiber, sodium and fats, and appropriate energy expenditure required for the weight goal.;Weight Management: Provide education and appropriate resources to help participant work on and attain dietary goals.;Weight Management/Obesity: Establish reasonable short term and long term weight goals.    Admit Weight 185 lb (83.9 kg)    Goal Weight: Short Term 180 lb (81.6 kg)    Goal Weight: Long Term 175 lb (79.4 kg)    Expected Outcomes Short Term: Continue to assess and modify interventions until short term weight is achieved;Long Term: Adherence to nutrition and physical activity/exercise program aimed toward attainment of established weight goal;Understanding recommendations for meals to include 15-35% energy as protein, 25-35% energy from fat, 35-60% energy from carbohydrates, less than 213m of dietary cholesterol, 20-35 gm of total fiber daily;Understanding of distribution of calorie intake throughout the day with the consumption of 4-5 meals/snacks;Weight Loss: Understanding of general recommendations for a balanced deficit meal plan, which promotes 1-2 lb weight loss per week and includes a negative energy balance of [phone] kcal/d    Improve shortness of breath with ADL's Yes    Intervention Provide education, individualized exercise plan and daily activity instruction to help decrease symptoms of SOB with activities of daily living.    Expected Outcomes Short Term: Improve cardiorespiratory fitness to achieve a reduction of symptoms when performing ADLs;Long Term:  Be able to perform more ADLs without symptoms or delay the onset of symptoms    Increase knowledge of respiratory medications and ability to use respiratory devices properly  Yes    Intervention Provide education and demonstration as needed of appropriate use of medications, inhalers, and oxygen therapy.    Expected Outcomes Short Term: Achieves understanding of medications use. Understands that oxygen is a medication prescribed by physician. Demonstrates appropriate use of inhaler and oxygen therapy.;Long Term: Maintain appropriate use of medications, inhalers, and oxygen therapy.    Hypertension Yes    Intervention Provide education on lifestyle modifcations including regular physical activity/exercise, weight management, moderate sodium restriction and increased consumption of fresh fruit, vegetables, and low fat dairy, alcohol moderation, and smoking cessation.;Monitor prescription use compliance.    Expected Outcomes Short Term: Continued assessment and intervention until BP is < 140/936mHG in hypertensive participants. < 130/8084mG in hypertensive participants with diabetes, heart failure or chronic kidney disease.;Long Term: Maintenance of blood pressure at goal levels.    Lipids Yes    Intervention Provide education and support for participant on nutrition & aerobic/resistive exercise along with prescribed medications to achieve LDL <79m24mDL >40mg58m Expected Outcomes Short Term: Participant states understanding of desired cholesterol values and is compliant with medications prescribed. Participant is following exercise prescription and nutrition guidelines.;Long Term: Cholesterol controlled with medications  as prescribed, with individualized exercise RX and with personalized nutrition plan. Value goals: LDL < 85m, HDL > 40 mg.             Education:Diabetes - Individual verbal and written instruction to review signs/symptoms of diabetes, desired ranges of glucose level fasting, after  meals and with exercise. Acknowledge that pre and post exercise glucose checks will be done for 3 sessions at entry of program.   Know Your Numbers and Heart Failure: - Group verbal and visual instruction to discuss disease risk factors for cardiac and pulmonary disease and treatment options.  Reviews associated critical values for Overweight/Obesity, Hypertension, Cholesterol, and Diabetes.  Discusses basics of heart failure: signs/symptoms and treatments.  Introduces Heart Failure Zone chart for action plan for heart failure.  Written material given at graduation. [facility]  Date 12/23/21  Educator [place]  Instruction Review Code 1- Verbalizes Understanding       Core Components/Risk Factors/Patient Goals Review:   Goals and Risk Factor Review     Row Name 12/30/21 1346 02/15/22 1419           Core Components/Risk Factors/Patient Goals Review   Personal Goals Review Weight Management/Obesity;Hypertension;Improve shortness of breath with ADL's;Lipids Weight Management/Obesity;Hypertension;Improve shortness of breath with ADL's;Lipids      Review Joe feels he is doing well in rehab so far. He continues to take his medications as directed without issues. He doesn't check his BP at home often, but he has a BP cuff; encouraged him to check BP at home at least 1-2x/week. Joe reports his shortness of breath has been doing well, but the heat can make it difficult. JWille Glaseris doing well.  He is doing well with his breathing and good about using his inhalers.  We reviewed what the purpose for each one was.  His pressures are doing well and he continues to monitor it at home.  He did have a high reading at the doctor's office, but it's been good at other place. He got good results with his 6MWT in the office. He decreased his prednisone to 10 mg.  JWille Glaseris doing well with his weight as well.      Expected Outcomes ST: check BP at home  1-2x/week  LT: monitor risk factors Short: conitnue to montior bp Long: Continue to montior risk facros.               Core Components/Risk Factors/Patient Goals at Discharge (Final Review):   Goals and Risk Factor Review - 02/15/22 1419       Core Components/Risk Factors/Patient Goals Review   Personal Goals Review Weight Management/Obesity;Hypertension;Improve shortness of breath with ADL's;Lipids    Review JWille Glaseris doing well.  He is doing well with his breathing and good about using his inhalers.  We reviewed what the purpose for each one was.  His pressures are doing well and he continues to monitor it at home.  He did have a high reading at the doctor's office, but it's been good at other place. He got good results with his 6MWT in the office. He decreased his prednisone to 10 mg.  JWille Glaseris doing well with his weight as well.    Expected Outcomes Short: conitnue to montior bp Long: Continue to montior risk facros.             ITP Comments:  ITP Comments     Row Name 12/03/21 1430 12/16/21 1705 12/23/21  [id number] 01/20/22 1344 02/17/22 0754   ITP Comments Virtual orientation call completed today. he has an appointment on Date: 12/16/21  for EP eval and gym Orientation.  Documentation of diagnosis can be found in [facility] Date: 10/28/21 . Completed 6MWT and gym orientation. Initial ITP created and sent for review to Dr. Ottie Glazier, Medical Director. 30 Day review completed. Medical Director ITP review done, changes made as directed, and signed approval by Medical Director.    NEW 30 Day review completed. Medical Director ITP review done, changes made as directed, and signed approval by Medical Director. 30 Day review completed. Medical Director ITP review done, changes made as directed, and signed approval by Medical Director.            Comments:

## 2022-02-17 NOTE — Progress Notes (Signed)
Daily Session Note  Patient Details  Name: Sean Waters MRN: [id number] Date of Birth: 10/24/1940 Referring Provider:   Flowsheet Row Pulmonary Rehab from 12/16/2021 in [facility] Cardiac and Pulmonary Rehab  Referring Provider Wallene Huh MD       Encounter Date: 02/17/2022  Check In:  Session Check In - 02/17/22 1509       Check-In   Supervising physician immediately available to respond to emergencies See telemetry face sheet for immediately available ER MD    Location [facility]-Cardiac & Pulmonary Rehab    Staff Present Alberteen Sam, MA, RCEP, CCRP, Mindi Curling, RN, ADN;Lindaann Gradilla Sherryll Burger, RN BSN    Virtual Visit No    Medication changes reported     No    Fall or balance concerns reported    No    Warm-up and Cool-down Performed on first and last piece of equipment    Resistance Training Performed Yes    VAD Patient? No    PAD/SET Patient? No      Pain Assessment   Currently in Pain? No/denies                Social History   Tobacco Use  Smoking Status Former   Packs/day: 1.00   Types: Cigarettes   Quit date: 03/17/1953   Years since quitting: 68.9  Smokeless Tobacco Never    Goals Met:  Independence with exercise equipment Exercise tolerated well No report of concerns or symptoms today Strength training completed today  Goals Unmet:  Not Applicable  Comments: Pt able to follow exercise prescription today without complaint.  Will continue to monitor for progression.    Dr. Emily Filbert is Medical Director for [place].  Dr. Ottie Glazier is Medical Director for [facility] Pulmonary Rehabilitation.

## 2022-02-22 ENCOUNTER — Encounter: Payer: Medicare HMO | Admitting: *Deleted

## 2022-02-22 DIAGNOSIS — J8410 Pulmonary fibrosis, unspecified: Secondary | ICD-10-CM | POA: Diagnosis not present

## 2022-02-22 NOTE — Progress Notes (Signed)
Daily Session Note  Patient Details  Name: Bayden Gil MRN: [id number] Date of Birth: 03/14/1941 Referring Provider:   Flowsheet Row Pulmonary Rehab from 12/16/2021 in [facility] Cardiac and Pulmonary Rehab  Referring Provider Wallene Huh MD       Encounter Date: 02/22/2022  Check In:  Session Check In - 02/22/22 1359       Check-In   Supervising physician immediately available to respond to emergencies See telemetry face sheet for immediately available ER MD    Location [facility]-Cardiac & Pulmonary Rehab    Staff Present Justin Mend, RCP,RRT,BSRT;Hurshel Bouillon Sherryll Burger, RN Odelia Gage, RN, ADN    Virtual Visit No    Medication changes reported     No    Fall or balance concerns reported    No    Warm-up and Cool-down Performed on first and last piece of equipment    Resistance Training Performed Yes    VAD Patient? No    PAD/SET Patient? No      Pain Assessment   Currently in Pain? No/denies                Social History   Tobacco Use  Smoking Status Former   Packs/day: 1.00   Types: Cigarettes   Quit date: 03/17/1953   Years since quitting: 68.9  Smokeless Tobacco Never    Goals Met:  Independence with exercise equipment Exercise tolerated well No report of concerns or symptoms today Strength training completed today  Goals Unmet:  Not Applicable  Comments: Pt able to follow exercise prescription today without complaint.  Will continue to monitor for progression.    Dr. Emily Filbert is Medical Director for [place].  Dr. Ottie Glazier is Medical Director for [facility] Pulmonary Rehabilitation.

## 2022-02-24 ENCOUNTER — Encounter: Payer: Medicare HMO | Admitting: *Deleted

## 2022-02-24 DIAGNOSIS — J8410 Pulmonary fibrosis, unspecified: Secondary | ICD-10-CM | POA: Diagnosis not present

## 2022-02-24 NOTE — Progress Notes (Signed)
Daily Session Note  Patient Details  Name: Sean Waters MRN: [id number] Date of Birth: December 01, 1940 Referring Provider:   Flowsheet Row Pulmonary Rehab from 12/16/2021 in [facility] Cardiac and Pulmonary Rehab  Referring Provider Wallene Huh MD       Encounter Date: 02/24/2022  Check In:  Session Check In - 02/24/22 1405       Check-In   Supervising physician immediately available to respond to emergencies See telemetry face sheet for immediately available ER MD    Location [facility]-Cardiac & Pulmonary Rehab    Staff Present Alberteen Sam, MA, RCEP, CCRP, Rosalio Macadamia, BS, ACSM CEP, Exercise Physiologist;Kenai Fluegel Sherryll Burger, RN BSN    Virtual Visit No    Medication changes reported     No    Fall or balance concerns reported    No    Warm-up and Cool-down Performed on first and last piece of equipment    Resistance Training Performed Yes    VAD Patient? No    PAD/SET Patient? No      Pain Assessment   Currently in Pain? No/denies                Social History   Tobacco Use  Smoking Status Former   Packs/day: 1.00   Types: Cigarettes   Quit date: 03/17/1953   Years since quitting: 68.9  Smokeless Tobacco Never    Goals Met:  Independence with exercise equipment Exercise tolerated well No report of concerns or symptoms today Strength training completed today  Goals Unmet:  Not Applicable  Comments: Pt able to follow exercise prescription today without complaint.  Will continue to monitor for progression.    Dr. Emily Filbert is Medical Director for [place].  Dr. Ottie Glazier is Medical Director for [facility] Pulmonary Rehabilitation.

## 2022-03-01 ENCOUNTER — Encounter: Payer: Medicare HMO | Admitting: *Deleted

## 2022-03-01 DIAGNOSIS — J8410 Pulmonary fibrosis, unspecified: Secondary | ICD-10-CM

## 2022-03-01 NOTE — Progress Notes (Signed)
Daily Session Note  Patient Details  Name: Sean Waters MRN: [id number] Date of Birth: [birth year] Referring Provider:   Flowsheet Row Pulmonary Rehab from 12/16/2021 in [facility] Cardiac and Pulmonary Rehab  Referring Provider Wallene Huh MD       Encounter Date: 03/01/2022  Check In:  Session Check In - 03/01/22 1400       Check-In   Supervising physician immediately available to respond to emergencies See telemetry face sheet for immediately available ER MD    Location [facility]-Cardiac & Pulmonary Rehab    Staff Present Justin Mend, RCP,RRT,BSRT;Hasheem Voland Sherryll Burger, RN Odelia Gage, RN, ADN    Virtual Visit No    Medication changes reported     No    Fall or balance concerns reported    No    Warm-up and Cool-down Performed on first and last piece of equipment    Resistance Training Performed Yes    VAD Patient? No    PAD/SET Patient? No      Pain Assessment   Currently in Pain? No/denies                Social History   Tobacco Use  Smoking Status Former   Packs/day: 1.00   Types: Cigarettes   Quit date: 03/17/1953   Years since quitting: 69.0  Smokeless Tobacco Never    Goals Met:  Independence with exercise equipment Exercise tolerated well No report of concerns or symptoms today Strength training completed today  Goals Unmet:  Not Applicable  Comments: Pt able to follow exercise prescription today without complaint.  Will continue to monitor for progression.    Dr. Emily Filbert is Medical Director for [place].  Dr. Ottie Glazier is Medical Director for [facility] Pulmonary Rehabilitation.

## 2022-03-03 ENCOUNTER — Encounter: Payer: Medicare HMO | Admitting: *Deleted

## 2022-03-03 DIAGNOSIS — J8410 Pulmonary fibrosis, unspecified: Secondary | ICD-10-CM | POA: Diagnosis not present

## 2022-03-03 NOTE — Progress Notes (Signed)
Daily Session Note  Patient Details  Name: Sean Waters MRN: [id number] Date of Birth: Jun 07, 1940 Referring Provider:   Flowsheet Row Pulmonary Rehab from 12/16/2021 in [facility] Cardiac and Pulmonary Rehab  Referring Provider Wallene Huh MD       Encounter Date: 03/03/2022  Check In:  Session Check In - 03/03/22 1406       Check-In   Supervising physician immediately available to respond to emergencies See telemetry face sheet for immediately available ER MD    Location [facility]-Cardiac & Pulmonary Rehab    Staff Present Earlean Shawl, BS, ACSM CEP, Exercise Physiologist;Kara Eliezer Bottom, MS, ASCM CEP, Exercise Physiologist;Meredith Sherryll Burger, RN Odelia Gage, RN, ADN    Virtual Visit No    Medication changes reported     No    Fall or balance concerns reported    No    Warm-up and Cool-down Performed on first and last piece of equipment    Resistance Training Performed Yes    VAD Patient? No    PAD/SET Patient? No      Pain Assessment   Currently in Pain? No/denies                Social History   Tobacco Use  Smoking Status Former   Packs/day: 1.00   Types: Cigarettes   Quit date: 03/17/1953   Years since quitting: 69.0  Smokeless Tobacco Never    Goals Met:  Independence with exercise equipment Exercise tolerated well No report of concerns or symptoms today Strength training completed today  Goals Unmet:  Not Applicable  Comments: Pt able to follow exercise prescription today without complaint.  Will continue to monitor for progression.    Dr. Emily Filbert is Medical Director for [place].  Dr. Ottie Glazier is Medical Director for [facility] Pulmonary Rehabilitation.

## 2022-03-08 ENCOUNTER — Encounter: Payer: Medicare HMO | Admitting: *Deleted

## 2022-03-08 DIAGNOSIS — J8410 Pulmonary fibrosis, unspecified: Secondary | ICD-10-CM | POA: Diagnosis not present

## 2022-03-08 NOTE — Progress Notes (Signed)
Daily Session Note  Patient Details  Name: Vidyuth Belsito MRN: [id number] Date of Birth: 20-Sep-1940 Referring Provider:   Flowsheet Row Pulmonary Rehab from 12/16/2021 in [facility] Cardiac and Pulmonary Rehab  Referring Provider Wallene Huh MD       Encounter Date: 03/08/2022  Check In:  Session Check In - 03/08/22 1356       Check-In   Supervising physician immediately available to respond to emergencies See telemetry face sheet for immediately available ER MD    Location [facility]-Cardiac & Pulmonary Rehab    Staff Present Darlyne Russian, RN, ADN;Isabellamarie Randa Luan Pulling, MA, RCEP, CCRP, CCET;Simone [place], [place]    Virtual Visit No    Medication changes reported     No    Fall or balance concerns reported    No    Warm-up and Cool-down Performed on first and last piece of equipment    Resistance Training Performed Yes    VAD Patient? No    PAD/SET Patient? No      Pain Assessment   Currently in Pain? No/denies                Social History   Tobacco Use  Smoking Status Former   Packs/day: 1.00   Types: Cigarettes   Quit date: 03/17/1953   Years since quitting: 69.0  Smokeless Tobacco Never    Goals Met:  Proper associated with RPD/PD & O2 Sat Independence with exercise equipment Using PLB without cueing & demonstrates good technique Exercise tolerated well No report of concerns or symptoms today Strength training completed today  Goals Unmet:  Not Applicable  Comments: Pt able to follow exercise prescription today without complaint.  Will continue to monitor for progression.    Dr. Emily Filbert is Medical Director for [place].  Dr. Ottie Glazier is Medical Director for [facility] Pulmonary Rehabilitation.

## 2022-03-10 ENCOUNTER — Encounter: Payer: Medicare HMO | Admitting: *Deleted

## 2022-03-10 DIAGNOSIS — J8410 Pulmonary fibrosis, unspecified: Secondary | ICD-10-CM | POA: Diagnosis not present

## 2022-03-10 NOTE — Progress Notes (Signed)
Daily Session Note  Patient Details  Name: Sean Waters MRN: [id number] Date of Birth: [birth year] Referring Provider:   Flowsheet Row Pulmonary Rehab from 12/16/2021 in [facility] Cardiac and Pulmonary Rehab  Referring Provider Wallene Huh MD       Encounter Date: 03/10/2022  Check In:  Session Check In - 03/10/22 1407       Check-In   Supervising physician immediately available to respond to emergencies See telemetry face sheet for immediately available ER MD    Location [facility]-Cardiac & Pulmonary Rehab    Staff Present Alberteen Sam, MA, RCEP, CCRP, Marylynn Pearson, MS, ASCM CEP, Exercise Physiologist;Yamari Ventola Sherryll Burger, RN Odelia Gage, RN, ADN    Virtual Visit No    Medication changes reported     No    Fall or balance concerns reported    No    Warm-up and Cool-down Performed on first and last piece of equipment    Resistance Training Performed Yes    VAD Patient? No    PAD/SET Patient? No      Pain Assessment   Currently in Pain? No/denies                Social History   Tobacco Use  Smoking Status Former   Packs/day: 1.00   Types: Cigarettes   Quit date: 03/17/1953   Years since quitting: 69.0  Smokeless Tobacco Never    Goals Met:  Independence with exercise equipment Exercise tolerated well No report of concerns or symptoms today Strength training completed today  Goals Unmet:  Not Applicable  Comments: Pt able to follow exercise prescription today without complaint.  Will continue to monitor for progression.    Dr. Emily Filbert is Medical Director for [place].  Dr. Ottie Glazier is Medical Director for [facility] Pulmonary Rehabilitation.

## 2022-03-15 ENCOUNTER — Encounter: Payer: Medicare HMO | Admitting: *Deleted

## 2022-03-15 DIAGNOSIS — J8410 Pulmonary fibrosis, unspecified: Secondary | ICD-10-CM

## 2022-03-15 NOTE — Progress Notes (Signed)
Daily Session Note  Patient Details  Name: Sean Waters MRN: [id number] Date of Birth: Jan 24, 1941 Referring Provider:   Flowsheet Row Pulmonary Rehab from 12/16/2021 in [facility] Cardiac and Pulmonary Rehab  Referring Provider Wallene Huh MD       Encounter Date: 03/15/2022  Check In:  Session Check In - 03/15/22 1410       Check-In   Supervising physician immediately available to respond to emergencies See telemetry face sheet for immediately available ER MD    Location [facility]-Cardiac & Pulmonary Rehab    Staff Present Justin Mend, Ernestina Patches, RN, ADN;Ulah Olmo Sherryll Burger, RN BSN    Virtual Visit No    Medication changes reported     No    Fall or balance concerns reported    No    Warm-up and Cool-down Performed on first and last piece of equipment    Resistance Training Performed Yes    VAD Patient? No    PAD/SET Patient? No      Pain Assessment   Currently in Pain? No/denies                Social History   Tobacco Use  Smoking Status Former   Packs/day: 1.00   Types: Cigarettes   Quit date: 03/17/1953   Years since quitting: 69.0  Smokeless Tobacco Never    Goals Met:  Independence with exercise equipment Exercise tolerated well No report of concerns or symptoms today Strength training completed today  Goals Unmet:  Not Applicable  Comments: Pt able to follow exercise prescription today without complaint.  Will continue to monitor for progression.    Dr. Emily Filbert is Medical Director for [place].  Dr. Ottie Glazier is Medical Director for [facility] Pulmonary Rehabilitation.

## 2022-03-17 ENCOUNTER — Encounter: Payer: Medicare HMO | Attending: Specialist | Admitting: *Deleted

## 2022-03-17 ENCOUNTER — Encounter: Payer: Self-pay | Admitting: *Deleted

## 2022-03-17 DIAGNOSIS — J8410 Pulmonary fibrosis, unspecified: Secondary | ICD-10-CM

## 2022-03-17 NOTE — Progress Notes (Signed)
Pulmonary Individual Treatment Plan  Patient Details  Name: Sean Waters MRN: [id number] Date of Birth: Jan 14, 1941 Referring Provider:   Flowsheet Row Pulmonary Rehab from 12/16/2021 in [facility] Cardiac and Pulmonary Rehab  Referring Provider Wallene Huh MD       Initial Encounter Date:  Flowsheet Row Pulmonary Rehab from 12/16/2021 in [facility] Cardiac and Pulmonary Rehab  Date 12/16/21       Visit Diagnosis: Pulmonary fibrosis ([place])  Patient's Home Medications on Admission:  Current Outpatient Medications:    albuterol (VENTOLIN HFA) 108 (90 Base) MCG/ACT inhaler, Inhale 2 puffs into the lungs every 6 (six) hours as needed., Disp: , Rfl:    ALPRAZolam (XANAX) 0.25 MG tablet, Take 0.25 mg by mouth as needed for anxiety., Disp: , Rfl:    enalapril (VASOTEC) 10 MG tablet, Take 1.5 tablets by mouth daily., Disp: , Rfl:    ezetimibe-simvastatin (VYTORIN) 10-10 MG tablet, Take 1 tablet by mouth at bedtime., Disp: , Rfl:    levothyroxine (SYNTHROID) 50 MCG tablet, Take 50 mcg by mouth daily., Disp: , Rfl:    predniSONE (DELTASONE) 10 MG tablet, Take 1 tablet by mouth daily., Disp: , Rfl:    predniSONE (DELTASONE) 5 MG tablet, Take by mouth., Disp: , Rfl:    tadalafil (CIALIS) 5 MG tablet, Take by mouth., Disp: , Rfl:   Past Medical History: No past medical history on file.  Tobacco Use: Social History   Tobacco Use  Smoking Status Former   Packs/day: 1.00   Types: Cigarettes   Quit date: 03/17/1953   Years since quitting: 69.0  Smokeless Tobacco Never    Labs: Review Flowsheet        No data to display           Pulmonary Assessment Scores:  Pulmonary Assessment Scores     Row Name 12/16/21 1707         ADL UCSD   ADL Phase Entry     SOB Score total 17     Rest 0     Walk 1     Stairs 1     Bath 0     Dress 0     Shop 1       CAT Score   CAT Score 4       mMRC Score   mMRC Score 1              UCSD: Self-administered rating of dyspnea  associated with activities of daily living (ADLs) 6-point scale (0 = "not at all" to 5 = "maximal or unable to do because of breathlessness")  Scoring Scores range from 0 to 120.  Minimally important difference is 5 units  CAT: CAT can identify the health impairment of COPD patients and is better correlated with disease progression.  CAT has a scoring range of zero to 40. The CAT score is classified into four groups of low (less than 10), medium (10 - 20), high (21-30) and very high (31-40) based on the impact level of disease on health status. A CAT score over 10 suggests significant symptoms.  A worsening CAT score could be explained by an exacerbation, poor medication adherence, poor inhaler technique, or progression of COPD or comorbid conditions.  CAT MCID is 2 points  mMRC: mMRC (Modified Medical Research Council) Dyspnea Scale is used to assess the degree of baseline functional disability in patients of respiratory disease due to dyspnea. No minimal important difference is established. A decrease in score of 1 point  or greater is considered a positive change.   Pulmonary Function Assessment:   Exercise Target Goals: Exercise Program Goal: Individual exercise prescription set using results from initial 6 min walk test and THRR while considering  patient's activity barriers and safety.   Exercise Prescription Goal: Initial exercise prescription builds to 30-45 minutes a day of aerobic activity, 2-3 days per week.  Home exercise guidelines will be given to patient during program as part of exercise prescription that the participant will acknowledge.  Education: Aerobic Exercise: - Group verbal and visual presentation on the components of exercise prescription. Introduces F.I.T.T principle from ACSM for exercise prescriptions.  Reviews F.I.T.T. principles of aerobic exercise including progression. Written material given at graduation. Flowsheet Row Pulmonary Rehab from 03/10/2022 in [facility]  Cardiac and Pulmonary Rehab  Education need identified 12/16/21       Education: Resistance Exercise: - Group verbal and visual presentation on the components of exercise prescription. Introduces F.I.T.T principle from ACSM for exercise prescriptions  Reviews F.I.T.T. principles of resistance exercise including progression. Written material given at graduation.    Education: Exercise & Equipment Safety: - Individual verbal instruction and demonstration of equipment use and safety with use of the equipment. Flowsheet Row Pulmonary Rehab from 03/10/2022 in [facility] Cardiac and Pulmonary Rehab  Education need identified 12/16/21  Date 12/16/21  Educator [place]  Instruction Review Code 1- Verbalizes Understanding       Education: Exercise Physiology & General Exercise Guidelines: - Group verbal and written instruction with models to review the exercise physiology of the cardiovascular system and associated critical values. Provides general exercise guidelines with specific guidelines to those with heart or lung disease.  Flowsheet Row Pulmonary Rehab from 03/10/2022 in [facility] Cardiac and Pulmonary Rehab  Date 01/13/22  Educator kl  Instruction Review Code 1- [employer] Understanding       Education: Flexibility, Balance, Mind/Body Relaxation: - Group verbal and visual presentation with interactive activity on the components of exercise prescription. Introduces F.I.T.T principle from ACSM for exercise prescriptions. Reviews F.I.T.T. principles of flexibility and balance exercise training including progression. Also discusses the mind body connection.  Reviews various relaxation techniques to help reduce and manage stress (i.e. Deep breathing, progressive muscle relaxation, and visualization). Balance handout provided to take home. Written material given at graduation.   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 HR 105 bpm     Max Ex. BP 152/72     2  Minute Post BP 132/76       Interval HR   1 Minute HR 79     2 Minute HR 94     3 Minute HR 105     4 Minute HR 105     5 Minute HR 100     6 Minute HR 105     2 Minute Post HR 70     Interval Heart Rate? Yes       Interval Oxygen   Interval Oxygen? Yes     Baseline Oxygen Saturation % 95 %     1 Minute Oxygen Saturation % 92 %     1 Minute Liters of Oxygen 0 L  RA     2 Minute Oxygen Saturation % 90 %     2 Minute Liters of Oxygen 0 L     3 Minute Oxygen Saturation % 88 %     3 Minute Liters of Oxygen 0 L     4 Minute Oxygen Saturation % 89 %     4 Minute Liters of Oxygen 0 L     5 Minute Oxygen Saturation % 88 %     5 Minute Liters of Oxygen 0 L     6 Minute Oxygen Saturation % 87 %     6 Minute Liters of Oxygen 0 L     2 Minute Post Oxygen Saturation % 96 %     2 Minute Post Liters of Oxygen 0 L             Oxygen Initial Assessment:  Oxygen Initial Assessment - 12/16/21 1707       Home Oxygen   Home Oxygen Device None    Sleep Oxygen Prescription CPAP    Liters per minute 5    Home Exercise Oxygen Prescription None    Home Resting Oxygen Prescription None    Compliance with Home Oxygen Use No      Initial 6 min Walk   Oxygen Used None      Program Oxygen Prescription   Program Oxygen Prescription None      Intervention   Short Term Goals To learn and understand  importance of monitoring SPO2 with pulse oximeter and demonstrate accurate use of the pulse oximeter.;To learn and understand importance of maintaining oxygen saturations>88%;To learn and demonstrate proper pursed lip breathing techniques or other breathing techniques. ;To learn and demonstrate proper use of respiratory medications    Long  Term Goals Compliance with respiratory medication;Exhibits proper breathing techniques, such as pursed lip breathing or other method taught during program session;Maintenance of O2 saturations>88%;Verbalizes importance of monitoring SPO2 with pulse oximeter and return demonstration             Oxygen Re-Evaluation:  Oxygen Re-Evaluation     Row Name 12/30/21 1353 02/15/22 1426 03/08/22 1436         Program Oxygen Prescription   Program Oxygen Prescription None None None       Home Oxygen   Home Oxygen Device None None None     Sleep Oxygen Prescription CPAP CPAP CPAP     Liters per minute _0 Home Exercise Oxygen Prescription None None None     Home Resting Oxygen Prescription None None None     Compliance with Home Oxygen Use Yes Yes Yes       Goals/Expected Outcomes   Short Term Goals To learn and understand importance of monitoring SPO2 with pulse oximeter  and demonstrate accurate use of the pulse oximeter.;To learn and understand importance of maintaining oxygen saturations>88%;To learn and demonstrate proper pursed lip breathing techniques or other breathing techniques. ;To learn and demonstrate proper use of respiratory medications To learn and understand importance of monitoring SPO2 with pulse oximeter and demonstrate accurate use of the pulse oximeter.;To learn and understand importance of maintaining oxygen saturations>88%;To learn and demonstrate proper pursed lip breathing techniques or other breathing techniques. ;To learn and demonstrate proper use of respiratory medications To learn and understand importance of monitoring SPO2 with  pulse oximeter and demonstrate accurate use of the pulse oximeter.;To learn and understand importance of maintaining oxygen saturations>88%;To learn and demonstrate proper pursed lip breathing techniques or other breathing techniques. ;To learn and demonstrate proper use of respiratory medications     Long  Term Goals Compliance with respiratory medication;Exhibits proper breathing techniques, such as pursed lip breathing or other method taught during program session;Maintenance of O2 saturations>88%;Verbalizes importance of monitoring SPO2 with pulse oximeter and return demonstration Compliance with respiratory medication;Exhibits proper breathing techniques, such as pursed lip breathing or other method taught during program session;Maintenance of O2 saturations>88%;Verbalizes importance of monitoring SPO2 with pulse oximeter and return demonstration Compliance with respiratory medication;Exhibits proper breathing techniques, such as pursed lip breathing or other method taught during program session;Maintenance of O2 saturations>88%;Verbalizes importance of monitoring SPO2 with pulse oximeter and return demonstration     Comments -- Sean Waters is good about using his CPAP each night.  He sleeps better with it.  He is doing well on his meds.  His saturations continue to do well.  We will conitnue to check in with him. Sean Waters is good about using his CPAP each night.  He sleeps better with it.  He is doing well on his meds.  His saturations continue to do well.  He meets with a team of pulmonologists at [facility] in December to dig deeper into the status of his lung disease and hopefully get more answers.     Goals/Expected Outcomes -- Short: Conitnue to use meds and CPAP daily Long: conitnue to monitor saturations Short: Conitnue to use meds and CPAP daily. Have appointment with [place] pulmonologists.  Long: conitnue to monitor saturations.              Oxygen Discharge (Final Oxygen Re-Evaluation):  Oxygen Re-Evaluation  - 03/08/22 1436       Program Oxygen Prescription   Program Oxygen Prescription None      Home Oxygen   Home Oxygen Device None    Sleep Oxygen Prescription CPAP    Liters per minute 5    Home Exercise Oxygen Prescription None    Home Resting Oxygen Prescription None    Compliance with Home Oxygen Use Yes      Goals/Expected Outcomes   Short Term Goals To learn and understand importance of monitoring SPO2 with pulse oximeter and demonstrate accurate use of the pulse oximeter.;To learn and understand importance of maintaining oxygen saturations>88%;To learn and demonstrate proper pursed lip breathing techniques or other breathing techniques. ;To learn and demonstrate proper use of respiratory medications    Long  Term Goals Compliance with respiratory medication;Exhibits proper breathing techniques, such as pursed lip breathing or other method taught during program session;Maintenance of O2 saturations>88%;Verbalizes importance of monitoring SPO2 with pulse oximeter and return demonstration    Comments Sean Waters is good about using his CPAP each night.  He sleeps better with it.  He is doing well on his meds.  His saturations  continue to do well.  He meets with a team of pulmonologists at [facility] in December to dig deeper into the status of his lung disease and hopefully get more answers.    Goals/Expected Outcomes Short: Conitnue to use meds and CPAP daily. Have appointment with [place] pulmonologists.  Long: conitnue to monitor saturations.             Initial Exercise Prescription:  Initial Exercise Prescription - 12/16/21 1700       Date of Initial Exercise RX and Referring Provider   Date 12/16/21    Referring Provider Wallene Huh MD      Oxygen   Maintain Oxygen Saturation 88% or higher      Treadmill   MPH 2    Grade 0.5    Minutes 15    METs 2.53      Recumbant Elliptical   Level 1    Watts 50    Minutes 15    METs 1.9      REL-XR   Level 1    Speed 50    Minutes 15     METs 1.9      Prescription Details   Frequency (times per week) 2    Duration Progress to 30 minutes of continuous aerobic without signs/symptoms of physical distress      Intensity   THRR 40-80% of Max Heartrate 95 - 124    Ratings of Perceived Exertion 11-13    Perceived Dyspnea 0-4      Progression   Progression Continue to progress workloads to maintain intensity without signs/symptoms of physical distress.      Resistance Training   Training Prescription Yes    Weight 4 lb    Reps 10-15             Perform Capillary Blood Glucose checks as needed.  Exercise Prescription Changes:   Exercise Prescription Changes     Row Name 12/16/21 1700 12/28/21 1100 01/04/22 1500 01/11/22 1400 01/27/22 0900     Response to Exercise   Blood Pressure (Admit) 140/74 106/64 -- 124/64 138/66   Blood Pressure (Exercise) 166/74 148/68 -- 118/58 144/62   Blood Pressure (Exit) 132/76 120/70 -- 118/62 110/60   Heart Rate (Admit) 66 bpm 75 bpm -- 92 bpm 100 bpm   Heart Rate (Exercise) 105 bpm 109 bpm -- 97 bpm 106 bpm   Heart Rate (Exit) 70 bpm 89 bpm -- 90 bpm 81 bpm   Oxygen Saturation (Admit) 95 % 94 % -- 94 % 93 %   Oxygen Saturation (Exercise) 88 % 88 % -- 89 % 90 %   Oxygen Saturation (Exit) 96 % 91 % -- 94 % 91 %   Rating of Perceived Exertion (Exercise) 11 11 -- 12 11   Perceived Dyspnea (Exercise) 0 -- -- 0 0   Symptoms none none -- none none   Comments 6MWT results 2nd full day of exercise -- -- --   Duration -- Progress to 30 minutes of  aerobic without signs/symptoms of physical distress -- Continue with 30 min of aerobic exercise without signs/symptoms of physical distress. Continue with 30 min of aerobic exercise without signs/symptoms of physical distress.   Intensity -- THRR unchanged -- THRR unchanged THRR unchanged     Progression   Progression -- Continue to progress workloads to maintain intensity without signs/symptoms of physical distress. -- Continue to  progress workloads to maintain intensity without signs/symptoms of physical distress. Continue to progress workloads to maintain intensity  without signs/symptoms of physical distress.   Average METs -- 2.39 -- 2.46 2.6     Resistance Training   Training Prescription -- Yes -- Yes Yes   Weight -- 4 lb -- 5 lb 5 lb   Reps -- 10-15 -- 10-15 10-15     Interval Training   Interval Training -- No -- No No     Treadmill   MPH -- 2 -- 2 --   Grade -- 0.5 -- 0.5 --   Minutes -- 15 -- 15 --   METs -- 2.67 -- 2.67 --     Recumbant Bike   Level -- -- -- -- 3.5   Minutes -- -- -- -- 15   METs -- -- -- -- 3.3     NuStep   Level -- -- -- 4 6   Minutes -- -- -- 15 15   METs -- -- -- 2.2 2.2     Recumbant Elliptical   Level -- 1.4 -- 1.4 --   Minutes -- 15 -- 15 --     REL-XR   Level -- 3 -- 3 --   Minutes -- 15 -- 15 --   METs -- 2.2 -- 2 --     Biostep-RELP   Level -- -- -- 1 --   Minutes -- -- -- 15 --   METs -- -- -- 3 --     Home Exercise Plan   Plans to continue exercise at -- -- Home (comment)  recumbent bike, stepper, walking Home (comment)  recumbent bike, stepper, walking Home (comment)  recumbent bike, stepper, walking   Frequency -- -- Add 3 additional days to program exercise sessions. Add 3 additional days to program exercise sessions. Add 3 additional days to program exercise sessions.   Initial Home Exercises Provided -- -- 01/04/22 01/04/22 01/04/22     Oxygen   Maintain Oxygen Saturation -- 88% or higher 88% or higher 88% or higher 88% or higher    Row Name 02/23/22 1000 03/08/22 1600           Response to Exercise   Blood Pressure (Admit) 136/64 118/68      Blood Pressure (Exit) 112/56 132/80      Heart Rate (Admit) 87 bpm 80 bpm      Heart Rate (Exercise) 113 bpm 114 bpm      Heart Rate (Exit) 70 bpm 85 bpm      Oxygen Saturation (Admit) 96 % 96 %      Oxygen Saturation (Exercise) 87 % 90 %      Oxygen Saturation (Exit) 95 % 95 %      Rating of  Perceived Exertion (Exercise) 12 12      Perceived Dyspnea (Exercise) 1 1      Symptoms none none      Duration Continue with 30 min of aerobic exercise without signs/symptoms of physical distress. Continue with 30 min of aerobic exercise without signs/symptoms of physical distress.      Intensity THRR unchanged THRR unchanged        Progression   Progression Continue to progress workloads to maintain intensity without signs/symptoms of physical distress. Continue to progress workloads to maintain intensity without signs/symptoms of physical distress.      Average METs 3.17 3.5        Resistance Training   Training Prescription Yes Yes      Weight 5 lb 5 lb      Reps 10-15 10-15  Interval Training   Interval Training No No        Treadmill   MPH 2.5 2.5      Grade 0.5 0.5      Minutes 15 15      METs 3.09 3.09        Recumbant Bike   Level 4 3.8      Minutes 15 15      METs 2.94 3.6        NuStep   Level 5 6      Minutes 15 15      METs 3.1 2.9        REL-XR   Level 9 8      Minutes 15 15      METs 3.9 4.9        Home Exercise Plan   Plans to continue exercise at Home (comment)  recumbent bike, stepper, walking Home (comment)  recumbent bike, stepper, walking      Frequency Add 3 additional days to program exercise sessions. Add 3 additional days to program exercise sessions.      Initial Home Exercises Provided 01/04/22 01/04/22        Oxygen   Maintain Oxygen Saturation 88% or higher 88% or higher               Exercise Comments:   Exercise Goals and Review:   Exercise Goals     Row Name 12/16/21 1720             Exercise Goals   Increase Physical Activity Yes       Intervention Provide advice, education, support and counseling about physical activity/exercise needs.;Develop an individualized exercise prescription for aerobic and resistive training based on initial evaluation findings, risk stratification, comorbidities and participant's  personal goals.       Expected Outcomes Short Term: Attend rehab on a regular basis to increase amount of physical activity.;Long Term: Add in home exercise to make exercise part of routine and to increase amount of physical activity.;Long Term: Exercising regularly at least 3-5 days a week.       Increase Strength and Stamina Yes       Intervention Provide advice, education, support and counseling about physical activity/exercise needs.;Develop an individualized exercise prescription for aerobic and resistive training based on initial evaluation findings, risk stratification, comorbidities and participant's personal goals.       Expected Outcomes Short Term: Increase workloads from initial exercise prescription for resistance, speed, and METs.;Short Term: Perform resistance training exercises routinely during rehab and add in resistance training at home;Long Term: Improve cardiorespiratory fitness, muscular endurance and strength as measured by increased METs and functional capacity (6MWT)       Able to understand and use rate of perceived exertion (RPE) scale Yes       Intervention Provide education and explanation on how to use RPE scale       Expected Outcomes Short Term: Able to use RPE daily in rehab to express subjective intensity level;Long Term:  Able to use RPE to guide intensity level when exercising independently       Able to understand and use Dyspnea scale Yes       Intervention Provide education and explanation on how to use Dyspnea scale       Expected Outcomes Short Term: Able to use Dyspnea scale daily in rehab to express subjective sense of shortness of breath during exertion;Long Term: Able to use Dyspnea scale to guide intensity level when  exercising independently       Knowledge and understanding of Target Heart Rate Range (THRR) Yes       Intervention Provide education and explanation of THRR including how the numbers were predicted and where they are located for reference        Expected Outcomes Short Term: Able to state/look up THRR;Long Term: Able to use THRR to govern intensity when exercising independently;Short Term: Able to use daily as guideline for intensity in rehab       Able to check pulse independently Yes       Intervention Review the importance of being able to check your own pulse for safety during independent exercise;Provide education and demonstration on how to check pulse in carotid and radial arteries.       Expected Outcomes Short Term: Able to explain why pulse checking is important during independent exercise;Long Term: Able to check pulse independently and accurately       Understanding of Exercise Prescription Yes       Intervention Provide education, explanation, and written materials on patient's individual exercise prescription       Expected Outcomes Short Term: Able to explain program exercise prescription;Long Term: Able to explain home exercise prescription to exercise independently                Exercise Goals Re-Evaluation :  Exercise Goals Re-Evaluation     Row Name 12/28/21 1159 12/30/21 1344 01/04/22 1509 01/11/22 1447 01/27/22 0950     Exercise Goal Re-Evaluation   Exercise Goals Review Increase Physical Activity;Increase Strength and Stamina;Understanding of Exercise Prescription Increase Physical Activity;Increase Strength and Stamina;Understanding of Exercise Prescription Increase Physical Activity;Increase Strength and Stamina;Understanding of Exercise Prescription Increase Physical Activity;Increase Strength and Stamina;Understanding of Exercise Prescription Increase Physical Activity;Increase Strength and Stamina;Understanding of Exercise Prescription   Comments Sean Waters is doing well for the first couple of sessions that he has been here. His oxygen did drop to 88% but he knows to be aware and to stop and PLB when he does. He worked at level 3 on the XR and RPE was appropriate. We will continue to monitor as he progresses in the  program. For exercise he will use the stepper and recumbent bike at home: he alternates the time from 1.5 hours and 30 minutes alternating days. He uses a pulse ox to monitor his HR and O2; he keeps his HR around 100-110 during exercise, his O2 stays around 92%. Reviewed that his target HR is 95-124. Reviewed home exercise with pt today.  Pt is already exercising on his stepper and recumbent bike for exercise. He exercises about 1 hour and alternates with 30 minutes every other day. We did talk about incorporating structured walking to switch up his routine. He is not interested in taking rest days after discussing the importance. He does home exercise on top of rehab in the same day and made sure he is aware that he is not overworking his muscles and that he is also intaking the appropriate and right amount of foods. He is deffering nutrition at this time. He works up to "amount of calories" and recommended to switch more to base off of RPE and HR. He states he usually reached 100-105 bpm HR during his exercise at home.   He is watching his O2 sats at home as he drops sometime during rehab. Reviewed THR, pulse, RPE, sign and symptoms, pulse oximetery and when to call 911 or MD. Also discussed weather considerations and indoor options.  Pt voiced understanding. Sean Waters is doing well in rehab.  He is up to 5 lb hand weights and 3 METs on the BioStep.  We will continue to monitor his progress. Sean Waters is doing well in rehab. He improved to level 6 on the T4. He also began using the recumbent bike and did well with level 3.5. He also increased his overall average MET levelt o 2.6 METs. We will continue to monitor his progress in the program.   Expected Outcomes Short: Continue current exercise prescription and watch O2 closely Long: Increase overall MET level Short: Continue current exercise prescription and watch O2 closely Long: Increase overall MET level Short: Watch O2 closely at home, incorporate some walking to switch  up routine Long: Exercise independently at home at appropriate prescription Short: Begin to increase workloads Long: Continue to improve stamina Short: Continue to increase workloads Long: Continue to improve strength and stamina    Row Name 02/09/22 1547 02/15/22 1415 02/23/22 1037 03/08/22 1424 03/08/22 1610     Exercise Goal Re-Evaluation   Exercise Goals Review Increase Physical Activity;Increase Strength and Stamina;Understanding of Exercise Prescription Increase Physical Activity;Increase Strength and Stamina;Understanding of Exercise Prescription Increase Physical Activity;Increase Strength and Stamina;Understanding of Exercise Prescription Increase Physical Activity;Increase Strength and Stamina;Understanding of Exercise Prescription Increase Physical Activity;Increase Strength and Stamina;Understanding of Exercise Prescription   Comments Sean Waters has not been here since last reivew as he has been out of town. We hope to see his attendance improve as returns back on 9/27. Sean Waters returned today.  He is feeling good with his exercise. He is walking and building a wall in his bathroom on his off days to keep up with his exercise.  He does feel like his strength and stamina are starting to recover. Sean Waters is doing well with exercise. He recently increased his overall average MET level to 3.17 METs. He also improved to level 4 on the recumbent bike and level 9 on the XR. He has tolerated the treadmill at a speed of 2.5 mph and an incline of 0.5% as well. We will continue to monitor his progress in the program. Sean Waters reports that he feels like he is doing well and feeling like he is breathing better and is stronger. He reports that he exercises at home almost every day using his stepper and recumbant bike and following the home exercise guidelines provided to him by the pumonary rehab program. He also attends rehab classes consistently and continues to make progress with his workloads. He feels that his pulse ox at home  does not read accurately when he is exercising, so it was recommended to him to bring it into class and make sure it is consistently reading with the pulse ox he wears in class. Sean Waters is doing well in rehab. He did go back to level 6 on the T4 Nustep. He also worked up to almost 5 METS on the XR! Oxygen saturations are staying above 88%. He would benefit from increasing more incline on the treadmill. We will continue to monitor.   Expected Outcomes Short: Maintain routine attendance Long: Finish [facility] Program Short: Continue to exericse on off days Long: conitnue to improve stamina Short: Continue to increase workload on the treadmill. Long: conitnue to improve stamina Short: check accuracy of pulse ox he uses at home. Long: continue to improve stamina and become independent with exercise routine, while monitoring oxygen satruations. Short: Increase incline on treadmill Long: Continue to increase overall MET level  Discharge Exercise Prescription (Final Exercise Prescription Changes):  Exercise Prescription Changes - 03/08/22 1600       Response to Exercise   Blood Pressure (Admit) 118/68    Blood Pressure (Exit) 132/80    Heart Rate (Admit) 80 bpm    Heart Rate (Exercise) 114 bpm    Heart Rate (Exit) 85 bpm    Oxygen Saturation (Admit) 96 %    Oxygen Saturation (Exercise) 90 %    Oxygen Saturation (Exit) 95 %    Rating of Perceived Exertion (Exercise) 12    Perceived Dyspnea (Exercise) 1    Symptoms none    Duration Continue with 30 min of aerobic exercise without signs/symptoms of physical distress.    Intensity THRR unchanged      Progression   Progression Continue to progress workloads to maintain intensity without signs/symptoms of physical distress.    Average METs 3.5      Resistance Training   Training Prescription Yes    Weight 5 lb    Reps 10-15      Interval Training   Interval Training No      Treadmill   MPH 2.5    Grade 0.5    Minutes 15    METs  3.09      Recumbant Bike   Level 3.8    Minutes 15    METs 3.6      NuStep   Level 6    Minutes 15    METs 2.9      REL-XR   Level 8    Minutes 15    METs 4.9      Home Exercise Plan   Plans to continue exercise at Home (comment)   recumbent bike, stepper, walking   Frequency Add 3 additional days to program exercise sessions.    Initial Home Exercises Provided 01/04/22      Oxygen   Maintain Oxygen Saturation 88% or higher             Nutrition:  Target Goals: Understanding of nutrition guidelines, daily intake of sodium <154m, cholesterol <2015m calories 30% from fat and 7% or less from saturated fats, daily to have 5 or more servings of fruits and vegetables.  Education: All About Nutrition: -Group instruction provided by verbal, written material, interactive activities, discussions, models, and posters to present general guidelines for heart healthy nutrition including fat, fiber, MyPlate, the role of sodium in heart healthy nutrition, utilization of the nutrition label, and utilization of this knowledge for meal planning. Follow up email sent as well. Written material given at graduation.   Biometrics:  Pre Biometrics - 12/16/21 1708       Pre Biometrics   Height _0  (1.626 m)    Weight 185 lb 3.2 oz (84 kg)    BMI (Calculated) 31.77    Single Leg Stand 24.3 seconds              Nutrition Therapy Plan and Nutrition Goals:  Nutrition Therapy & Goals - 02/15/22 1418       Nutrition Therapy   RD appointment deferred Yes             Nutrition Assessments:  MEDIFICTS Score Key: ?70 Need to make dietary changes  40-70 Heart Healthy Diet ? 40 Therapeutic Level Cholesterol Diet  [facility]  Picture Your Plate Total Score on Admission 68      Picture Your Plate Scores: <4<86nhealthy  dietary pattern with much room for improvement. 41-50 Dietary pattern unlikely to  meet recommendations for good health and room for improvement. 51-60 More healthful dietary pattern, with some room for improvement.  >60 Healthy dietary pattern, although there may be some specific behaviors that could be improved.   Nutrition Goals Re-Evaluation:  Nutrition Goals Re-Evaluation     [place] Name 02/15/22 1419 03/08/22 1430           Goals   Nutrition Goal Continues to defer appointments Continues to defer appointments      Comment Sean Waters is feeling good about his diet. Sean Waters is feeling good about his diet.      Expected Outcome Continue to focus on healthy eating. Continue to focus on healthy eating.               Nutrition Goals Discharge (Final Nutrition Goals Re-Evaluation):  Nutrition Goals Re-Evaluation - 03/08/22 1430       Goals   Nutrition Goal Continues to defer appointments    Comment Sean Waters is feeling good about his diet.    Expected Outcome Continue to focus on healthy eating.             Psychosocial: Target Goals: Acknowledge presence or absence of significant depression and/or stress, maximize coping skills, provide positive support system. Participant is able to verbalize types and ability to use techniques and skills needed for reducing stress and depression.   Education: Stress, Anxiety, and Depression - Group verbal and visual presentation to define topics covered.  Reviews how body is impacted by stress, anxiety, and depression.  Also discusses healthy ways to reduce stress and to treat/manage anxiety and depression.  Written material given at graduation. Flowsheet Row Pulmonary Rehab from 03/10/2022 in [facility] Card[facility] and Pulmonary Rehab  Date 03/10/22  Educator [place]  Instruction Review Code 1- [employer] Understanding       Education: Sleep Hygiene -Provides group verbal and written instruction about how sleep can affect your health.  Define sleep hygiene, discuss sleep cycles and impact of sleep habits. Review good sleep hygiene tips.     Initial Review & Psychosocial Screening:  Initial Psych Review & Screening - 12/03/21 1329       Initial Review   Current issues with Current Stress Concerns    Source of Stress Concerns Chronic Illness    Comments He reports feeling stress with his diagnosis. He has called a patient advocate and told them what Dr. Raul Del said about his prognosis and that he didn't feel like he was supported; he didn't have someone on staff to talk to after being given his prognosis. When he got home he was depressed, lost 15 lbs, and thought he was going to die. He was looking for guidance and didn't feel like he got it - he spoke to patient relations and did not get a response. He reports that he was diagnosed in March and he was the one to ask for a referall to rehab.      Family Dynamics   Good Support System? 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     Barriers   Psychosocial barriers to participate in program The patient should benefit from training in stress management and relaxation.      Screening Interventions   Interventions Encouraged to exercise;Provide feedback about the scores to participant;To provide support and resources with identified psychosocial needs    Expected  Outcomes Short Term goal: Utilizing psychosocial counselor, staff and physician to assist with identification of specific Stressors or current issues interfering with healing process. Setting desired goal for each stressor or current issue identified.;Long Term Goal: Stressors or current issues are controlled or eliminated.;Short Term goal: Identification and review with participant of any Quality of Life or Depression concerns found by scoring the questionnaire.;Long Term goal: The participant improves quality of Life and PHQ9 Scores as seen by post scores and/or verbalization of changes             Quality of Life Scores:  Scores of 19 and below  usually indicate a poorer quality of life in these areas.  A difference of  2-3 points is a clinically meaningful difference.  A difference of 2-3 points in the total score of the Quality of Life Index has been associated with significant improvement in overall quality of life, self-image, physical symptoms, and general health in studies assessing change in quality of life.  PHQ-9: Review Flowsheet       12/16/2021  Depression screen PHQ 2/9  Decreased Interest 0  Down, Depressed, Hopeless 0  PHQ - 2 Score 0  Altered sleeping 0  Tired, decreased energy 1  Change in appetite 0  Feeling bad or failure about yourself  0  Trouble concentrating 0  Moving slowly or fidgety/restless 0  Suicidal thoughts 0  PHQ-9 Score 1  Difficult doing work/chores Somewhat difficult   Interpretation of Total Score  Total Score Depression Severity:  1-4 = Minimal depression, 5-9 = Mild depression, 10-14 = Moderate depression, 15-19 = Moderately severe depression, 20-27 = Severe depression   Psychosocial Evaluation and Intervention:  Psychosocial Evaluation - 12/03/21 1433       Psychosocial Evaluation & Interventions   Interventions Stress management education;Relaxation education;Encouraged to exercise with the program and follow exercise prescription    Comments He reports feeling stress with his diagnosis. He has called a patient advocate and told them what Dr. Raul Del said about his prognosis and that he didn't feel like he was supported; he didn't have someone on staff to talk to after being given his prognosis. When he got home he was depressed, lost 15 lbs, and thought he was going to die. He was looking for guidance and didn't feel like he got it - he spoke to patient relations and did not get a response. He reports that he was diagnosed in March and he was the one to ask for a referall to rehab. He reports that his wife is also his patient advocate when he goes to the doctor and he is a Occupational psychologist of the  [employer] to help with support. He also has two children and 6 grandchildren in [place] who he visits. He has been retired and is still working on relaxing in the afternoon instead of being productive; in the mornings he exercises and plays the piano. For exercise he will use the stepper and recumbent bike at home: he alternates the time from 1.5 hours and 30 minutes alternating days. He uses a pulse ox to monitor his HR and O2; he keeps his HR around 100 during exercise. He is excited to start Pulmonary Rehab.    Expected Outcomes ST: attend all scheduled exercise/education appointments, progress with exercise prescription while maintaining SpO2 <88%  LT: Improve shortness of breath with ADLs    Continue Psychosocial Services  Follow up required by staff  Psychosocial Re-Evaluation:  Psychosocial Re-Evaluation     Row Name 12/30/21 1343 12/30/21 1403 02/15/22 1416 03/08/22 1430       Psychosocial Re-Evaluation   Current issues with Current Stress Concerns -- Current Stress Concerns Current Stress Concerns    Comments Sean Waters reports that his wife is also his patient advocate when he goes to the doctor and he is a Occupational psychologist of the [employer] to help with support. He also has two children and 6 grandchildren in [place] who he visits. He has been retired and is still working on relaxing in the afternoon instead of being productive; in the mornings he exercises and plays the piano. He is excited as he is going to audit a jazz ensemble class soon at [place] [facility] and he is going to [place] to play piano. He feels better about his diagnosis since being on medications and starting rehab as he feels it is helping. he is still upset with how he feels like he was treated when given his diagnosis and would like to go back in person to talk to someone about it. -- Sean Waters is doing well in rehab.  He was out for a bit, but now doing well.  He feels good overall.  He is sleeping good for  most part.  He is good about using his CPAP each night.  He will get up once, with one trip to bathroom.  He is usually able to get back to sleep.  He is still playing the piano as an outlet. Patient reports no new changes in stress, sleep, or mental health concerns. He reports that we continues to wear his CPAP and sleeps well most nights.    Expected Outcomes ST: continue to attend rehab for mental health boost LT: continue to engage in stress reducing activites -- Short: Continue to use piano as a stress release Long: continue to stay positive Short: Continue to use piano as a stress release, and continue to use CPAP to aid in quality sleep.  Long: continue to stay positive    Interventions Encouraged to attend Pulmonary Rehabilitation for the exercise -- Encouraged to attend Pulmonary Rehabilitation for the exercise Encouraged to attend Pulmonary Rehabilitation for the exercise    Continue Psychosocial Services  Follow up required by staff -- -- Follow up required by staff             Psychosocial Discharge (Final Psychosocial Re-Evaluation):  Psychosocial Re-Evaluation - 03/08/22 1430       Psychosocial Re-Evaluation   Current issues with Current Stress Concerns    Comments Patient reports no new changes in stress, sleep, or mental health concerns. He reports that we continues to wear his CPAP and sleeps well most nights.    Expected Outcomes Short: Continue to use piano as a stress release, and continue to use CPAP to aid in quality sleep.  Long: continue to stay positive    Interventions Encouraged to attend Pulmonary Rehabilitation for the exercise    Continue Psychosocial Services  Follow up required by staff             Education: Education Goals: Education classes will be provided on a weekly basis, covering required topics. Participant will state understanding/return demonstration of topics presented.  Learning Barriers/Preferences:   General Pulmonary Education  Topics:  Infection Prevention: - Provides verbal and written material to individual with discussion of infection control including proper hand washing and proper equipment cleaning during exercise session. Flowsheet Row Pulmonary Rehab from 03/10/2022  in [facility] Cardiac and Pulmonary Rehab  Education need identified 12/16/21  Date 12/16/21  Educator [place]  Instruction Review Code 1- Verbalizes Understanding       Falls Prevention: - Provides verbal and written material to individual with discussion of falls prevention and safety. Flowsheet Row Pulmonary Rehab from 12/03/2021 in [facility] Card[facility] and Pulmonary Rehab  Education need identified 12/03/21  Date 12/03/21  Educator [place]  Instruction Review Code 1- Verbalizes Understanding       Chronic Lung Disease Review: - Group verbal instruction with posters, models, PowerPoint presentations and videos,  to review new updates, new respiratory medications, new advancements in procedures and treatments. Providing information on websites and "800" numbers for continued self-education. Includes information about supplement oxygen, available portable oxygen systems, continuous and intermittent flow rates, oxygen safety, concentrators, and Medicare reimbursement for oxygen. Explanation of Pulmonary Drugs, including class, frequency, complications, importance of spacers, rinsing mouth after steroid MDI's, and proper cleaning methods for nebulizers. Review of basic lung anatomy and physiology related to function, structure, and complications of lung disease. Review of risk factors. Discussion about methods for diagnosing sleep apnea and types of masks and machines for OSA. Includes a review of the use of types of environmental controls: home humidity, furnaces, filters, dust mite/pet prevention, HEPA vacuums. Discussion about weather changes, air quality and the benefits of nasal washing. Instruction on Warning signs, infection symptoms, calling MD promptly,  preventive modes, and value of vaccinations. Review of effective airway clearance, coughing and/or vibration techniques. Emphasizing that all should Create an Action Plan. Written material given at graduation. Flowsheet Row Pulmonary Rehab from 03/10/2022 in [facility] Cardiac and Pulmonary Rehab  Education need identified 12/16/21  Date 12/30/21  Educator [facility]  Instruction Review Code 1- Verbalizes Understanding       AED/CPR: - Group verbal and written instruction with the use of models to demonstrate the basic use of the AED with the basic ABC's of resuscitation.    Anatomy and Cardiac Procedures: - Group verbal and visual presentation and models provide information about basic cardiac anatomy and function. Reviews the testing methods done to diagnose heart disease and the outcomes of the test results. Describes the treatment choices: Medical Management, Angioplasty, or Coronary Bypass Surgery for treating various heart conditions including Myocardial Infarction, Angina, Valve Disease, and Cardiac Arrhythmias.  Written material given at graduation.   Medication Safety: - Group verbal and visual instruction to review commonly prescribed medications for heart and lung disease. Reviews the medication, class of the drug, and side effects. Includes the steps to properly store meds and maintain the prescription regimen.  Written material given at graduation.   Other: -Provides group and verbal instruction on various topics (see comments)   Knowledge Questionnaire Score:  Knowledge Questionnaire Score - 12/16/21 1706       Knowledge Questionnaire Score   Pre Score 13/18              Core Components/Risk Factors/Patient Goals at Admission:  Personal Goals and Risk Factors at Admission - 12/16/21 1722       Core Components/Risk Factors/Patient Goals on Admission    Weight Management Yes;Weight Loss    Intervention Weight Management: Develop a combined nutrition and exercise program  designed to reach desired caloric intake, while maintaining appropriate intake of nutrient and fiber, sodium and fats, and appropriate energy expenditure required for the weight goal.;Weight Management: Provide education and appropriate resources to help participant work on and attain dietary goals.;Weight Management/Obesity: Establish reasonable short term and  long term weight goals.    Admit Weight 185 lb (83.9 kg)    Goal Weight: Short Term 180 lb (81.6 kg)    Goal Weight: Long Term 175 lb (79.4 kg)    Expected Outcomes Short Term: Continue to assess and modify interventions until short term weight is achieved;Long Term: Adherence to nutrition and physical activity/exercise program aimed toward attainment of established weight goal;Understanding recommendations for meals to include 15-35% energy as protein, 25-35% energy from fat, 35-60% energy from carbohydrates, less than 227m of dietary cholesterol, 20-35 gm of total fiber daily;Understanding of distribution of calorie intake throughout the day with the consumption of 4-5 meals/snacks;Weight Loss: Understanding of general recommendations for a balanced deficit meal plan, which promotes 1-2 lb weight loss per week and includes a negative energy balance of [phone] kcal/d    Improve shortness of breath with ADL's Yes    Intervention Provide education, individualized exercise plan and daily activity instruction to help decrease symptoms of SOB with activities of daily living.    Expected Outcomes Short Term: Improve cardiorespiratory fitness to achieve a reduction of symptoms when performing ADLs;Long Term: Be able to perform more ADLs without symptoms or delay the onset of symptoms    Increase knowledge of respiratory medications and ability to use respiratory devices properly  Yes    Intervention Provide education and demonstration as needed of appropriate use of medications, inhalers, and oxygen therapy.    Expected Outcomes Short Term: Achieves  understanding of medications use. Understands that oxygen is a medication prescribed by physician. Demonstrates appropriate use of inhaler and oxygen therapy.;Long Term: Maintain appropriate use of medications, inhalers, and oxygen therapy.    Hypertension Yes    Intervention Provide education on lifestyle modifcations including regular physical activity/exercise, weight management, moderate sodium restriction and increased consumption of fresh fruit, vegetables, and low fat dairy, alcohol moderation, and smoking cessation.;Monitor prescription use compliance.    Expected Outcomes Short Term: Continued assessment and intervention until BP is < 140/918mHG in hypertensive participants. < 130/8064mG in hypertensive participants with diabetes, heart failure or chronic kidney disease.;Long Term: Maintenance of blood pressure at goal levels.    Lipids Yes    Intervention Provide education and support for participant on nutrition & aerobic/resistive exercise along with prescribed medications to achieve LDL <76m67mDL >40mg57m Expected Outcomes Short Term: Participant states understanding of desired cholesterol values and is compliant with medications prescribed. Participant is following exercise prescription and nutrition guidelines.;Long Term: Cholesterol controlled with medications as prescribed, with individualized exercise RX and with personalized nutrition plan. Value goals: LDL < 76mg,13m > 40 mg.             Education:Diabetes - Individual verbal and written instruction to review signs/symptoms of diabetes, desired ranges of glucose level fasting, after meals and with exercise. Acknowledge that pre and post exercise glucose checks will be done for 3 sessions at entry of program.   Know Your Numbers and Heart Failure: - Group verbal and visual instruction to discuss disease risk factors for cardiac and pulmonary disease and treatment options.  Reviews associated critical values for  Overweight/Obesity, Hypertension, Cholesterol, and Diabetes.  Discusses basics of heart failure: signs/symptoms and treatments.  Introduces Heart Failure Zone chart for action plan for heart failure.  Written material given at graduation. Flowsheet Row [facility]monary [facility]  Date 12/23/21  Educator SB  Instruction Review Code 1- Verbalizes Understanding  Core Components/Risk Factors/Patient Goals Review:   Goals and Risk Factor Review     Row Name 12/30/21 1346 02/15/22 1419 03/08/22 1432         Core Components/Risk Factors/Patient Goals Review   Personal Goals Review Weight Management/Obesity;Hypertension;Improve shortness of breath with ADL's;Lipids Weight Management/Obesity;Hypertension;Improve shortness of breath with ADL's;Lipids Weight Management/Obesity;Hypertension;Improve shortness of breath with ADL's;Lipids     Review Sean Waters feels he is doing well in rehab so far. He continues to take his medications as directed without issues. He doesn't check his BP at home often, but he has a BP cuff; encouraged him to check BP at home at least 1-2x/week. Sean Waters reports his shortness of breath has been doing well, but the heat can make it difficult. Sean Waters is doing well.  He is doing well with his breathing and good about using his inhalers.  We reviewed what the purpose for each one was.  His pressures are doing well and he continues to monitor it at home.  He did have a high reading at the doctor's office, but it's been good at other place. He got good results with his 6MWT in the office. He decreased his prednisone to 10 mg.  Sean Waters is doing well with his weight as well. Sean Waters's weight has been maintained. He feels that he is improving with his SOB and while he is weighting to meet with a team of pulmonologists at [facility] in [place] he is at least feeling better then he was. He is hoping to get more answers once he meets with these new doctors regarding the  status of his lung disease. He continues to take all meds a prescribed.     Expected Outcomes ST: check BP at home 1-2x/week  LT: monitor risk factors Short: conitnue to montior bp Long: Continue to montior risk facros. Short: meet with team of pumonologists at [facility] to get more answers about status of lung disease. Long: continue to monitor risk factors.              Core Components/Risk Factors/Patient Goals at Discharge (Final Review):   Goals and Risk Factor Review - 03/08/22 1432       Core Components/Risk Factors/Patient Goals Review   Personal Goals Review Weight Management/Obesity;Hypertension;Improve shortness of breath with ADL's;Lipids    Review Sean Waters's weight has been maintained. He feels that he is improving with his SOB and while he is weighting to meet with a team of pulmonologists at [facility] in [place] he is at least feeling better then he was. He is hoping to get more answers once he meets with these new doctors regarding the status of his lung disease. He continues to take all meds a prescribed.    Expected Outcomes Short: meet with team of pumonologists at [facility] to get more answers about status of lung disease. Long: continue to monitor risk factors.             ITP Comments:  ITP Comments     Row Name 12/03/21 1430 12/16/21 1705 12/23/21 0955 01/20/22 1344 02/17/22 0754   ITP Comments Virtual orientation call completed today. he has an appointment on Date: 12/16/21  for EP eval and gym Orientation.  Documentation of diagnosis can be found in [facility] Date: 10/28/21 . Completed 6MWT and gym orientation. Initial ITP created and sent for review to Dr. Ottie Glazier, Medical Director. 30 Day review completed. Medical Director ITP review done, changes made as directed, and signed approval by Medical Director.    NEW 30  Day review completed. Medical Director ITP review done, changes made as directed, and signed approval by Medical Director. 30 Day review completed. Medical Director ITP  review done, changes made as directed, and signed approval by Medical Director.    [place] Name 03/17/22 1026           ITP Comments 30 Day review completed. Medical Director ITP review done, changes made as directed, and signed approval by Medical Director.                Comments:

## 2022-03-17 NOTE — Progress Notes (Signed)
Daily Session Note  Patient Details  Name: Sean Waters MRN: [id number] Date of Birth: [birth year] Referring Provider:   Flowsheet Row Pulmonary Rehab from 12/16/2021 in [facility] Cardiac and Pulmonary Rehab  Referring Provider Wallene Huh MD       Encounter Date: 03/17/2022  Check In:  Session Check In - 03/17/22 1400       Check-In   Supervising physician immediately available to respond to emergencies See telemetry face sheet for immediately available ER MD    Location [facility]-Cardiac & Pulmonary Rehab    Staff Present Alberteen Sam, MA, RCEP, CCRP, CCET;Braven Wolk [place], RN Odelia Gage, RN, ADN    Virtual Visit No    Medication changes reported     No    Fall or balance concerns reported    No    Warm-up and Cool-down Performed on first and last piece of equipment    Resistance Training Performed Yes    VAD Patient? No    PAD/SET Patient? No      Pain Assessment   Currently in Pain? No/denies                Social History   Tobacco Use  Smoking Status Former   Packs/day: 1.00   Types: Cigarettes   Quit date: 03/17/1953   Years since quitting: 69.0  Smokeless Tobacco Never    Goals Met:  Independence with exercise equipment Exercise tolerated well No report of concerns or symptoms today Strength training completed today  Goals Unmet:  Not Applicable  Comments: Pt able to follow exercise prescription today without complaint.  Will continue to monitor for progression.    Dr. Emily Filbert is Medical Director for [place].  Dr. Ottie Glazier is Medical Director for [facility] Pulmonary Rehabilitation.

## 2022-03-22 ENCOUNTER — Encounter: Payer: Medicare HMO | Admitting: *Deleted

## 2022-03-22 DIAGNOSIS — J8410 Pulmonary fibrosis, unspecified: Secondary | ICD-10-CM | POA: Diagnosis not present

## 2022-03-22 NOTE — Progress Notes (Signed)
Daily Session Note  Patient Details  Name: Cornie Herrington MRN: [id number] Date of Birth: [birth year] Referring Provider:   Flowsheet Row Pulmonary Rehab from 12/16/2021 in [facility] Cardiac and Pulmonary Rehab  Referring Provider Wallene Huh MD       Encounter Date: 03/22/2022  Check In:  Session Check In - 03/22/22 1403       Check-In   Supervising physician immediately available to respond to emergencies See telemetry face sheet for immediately available ER MD    Location [facility]-Cardiac & Pulmonary Rehab    Staff Present Justin Mend, RCP,RRT,BSRT;Antwane Grose Sherryll Burger, RN Odelia Gage, RN, ADN    Virtual Visit No    Medication changes reported     No    Fall or balance concerns reported    No    Warm-up and Cool-down Performed on first and last piece of equipment    Resistance Training Performed Yes    VAD Patient? No    PAD/SET Patient? No      Pain Assessment   Currently in Pain? No/denies                Social History   Tobacco Use  Smoking Status Former   Packs/day: 1.00   Types: Cigarettes   Quit date: 03/17/1953   Years since quitting: 69.0  Smokeless Tobacco Never    Goals Met:  Independence with exercise equipment Exercise tolerated well No report of concerns or symptoms today Strength training completed today  Goals Unmet:  Not Applicable  Comments: Pt able to follow exercise prescription today without complaint.  Will continue to monitor for progression.    Dr. Emily Filbert is Medical Director for [place].  Dr. Ottie Glazier is Medical Director for [facility] Pulmonary Rehabilitation.

## 2022-03-24 ENCOUNTER — Encounter: Payer: Medicare HMO | Admitting: *Deleted

## 2022-03-24 DIAGNOSIS — J8410 Pulmonary fibrosis, unspecified: Secondary | ICD-10-CM

## 2022-03-24 NOTE — Progress Notes (Signed)
Daily Session Note  Patient Details  Name: Sean Waters MRN: [id number] Date of Birth: 02/02/1941 Referring Provider:   Flowsheet Row Pulmonary Rehab from 12/16/2021 in [facility] Cardiac and Pulmonary Rehab  Referring Provider Wallene Huh MD       Encounter Date: 03/24/2022  Check In:  Session Check In - 03/24/22 1402       Check-In   Supervising physician immediately available to respond to emergencies See telemetry face sheet for immediately available ER MD    Location [facility]-Cardiac & Pulmonary Rehab    Staff Present Alberteen Sam, MA, RCEP, CCRP, CCET;[name] [place], RN Odelia Gage, RN, ADN    Virtual Visit No    Medication changes reported     No    Fall or balance concerns reported    No    Warm-up and Cool-down Performed on first and last piece of equipment    Resistance Training Performed Yes    VAD Patient? No    PAD/SET Patient? No      Pain Assessment   Currently in Pain? No/denies                Social History   Tobacco Use  Smoking Status Former   Packs/day: 1.00   Types: Cigarettes   Quit date: 03/17/1953   Years since quitting: 69.0  Smokeless Tobacco Never    Goals Met:  Independence with exercise equipment Exercise tolerated well No report of concerns or symptoms today Strength training completed today  Goals Unmet:  Not Applicable  Comments: Pt able to follow exercise prescription today without complaint.  Will continue to monitor for progression.    Dr. Emily Filbert is Medical Director for [place].  Dr. Ottie Glazier is Medical Director for [facility] Pulmonary Rehabilitation.

## 2022-03-29 ENCOUNTER — Encounter: Payer: Medicare HMO | Admitting: *Deleted

## 2022-03-29 DIAGNOSIS — J8410 Pulmonary fibrosis, unspecified: Secondary | ICD-10-CM | POA: Diagnosis not present

## 2022-03-29 NOTE — Progress Notes (Signed)
Daily Session Note  Patient Details  Name: Sean Waters MRN: [id number] Date of Birth: [birth year] Referring Provider:   Flowsheet Row Pulmonary Rehab from 12/16/2021 in [facility] Cardiac and Pulmonary Rehab  Referring Provider Wallene Huh MD       Encounter Date: 03/29/2022  Check In:  Session Check In - 03/29/22 1447       Check-In   Supervising physician immediately available to respond to emergencies See telemetry face sheet for immediately available ER MD    Location [facility]-Cardiac & Pulmonary Rehab    Staff Present [name], RN, BSN, CCRP;Trevontae Holden, Ernestina Patches, RN, [place]    Virtual Visit No    Medication changes reported     No    Fall or balance concerns reported    No    Warm-up and Cool-down Performed on first and last piece of equipment    Resistance Training Performed Yes    VAD Patient? No    PAD/SET Patient? No      Pain Assessment   Currently in Pain? No/denies                Social History   Tobacco Use  Smoking Status Former   Packs/day: 1.00   Types: Cigarettes   Quit date: 03/17/1953   Years since quitting: 69.0  Smokeless Tobacco Never    Goals Met:  Proper associated with RPD/PD & O2 Sat Independence with exercise equipment Exercise tolerated well No report of concerns or symptoms today  Goals Unmet:  Not Applicable  Comments: Pt able to follow exercise prescription today without complaint.  Will continue to monitor for progression.    Dr. Emily Filbert is Medical Director for [place].  Dr. Ottie Glazier is Medical Director for [facility] Pulmonary Rehabilitation.

## 2022-04-01 ENCOUNTER — Encounter: Payer: Medicare HMO | Admitting: *Deleted

## 2022-04-01 DIAGNOSIS — J8410 Pulmonary fibrosis, unspecified: Secondary | ICD-10-CM | POA: Diagnosis not present

## 2022-04-01 NOTE — Progress Notes (Signed)
Daily Session Note  Patient Details  Name: Sean Waters MRN: [id number] Date of Birth: Jul 18, 1940 Referring Provider:   Flowsheet Row Pulmonary Rehab from 12/16/2021 in [facility] Cardiac and Pulmonary Rehab  Referring Provider Wallene Huh MD       Encounter Date: 04/01/2022  Check In:  Session Check In - 04/01/22 1402       Check-In   Supervising physician immediately available to respond to emergencies See telemetry face sheet for immediately available ER MD    Location [facility]-Cardiac & Pulmonary Rehab    Staff Present Justin Mend, Ernestina Patches, RN, ADN;Laureen Owens Shark, BS, RRT, CPFT    Virtual Visit No    Medication changes reported     No    Fall or balance concerns reported    No    Warm-up and Cool-down Performed on first and last piece of equipment    Resistance Training Performed Yes    VAD Patient? No    PAD/SET Patient? No      Pain Assessment   Currently in Pain? No/denies                Social History   Tobacco Use  Smoking Status Former   Packs/day: 1.00   Types: Cigarettes   Quit date: 03/17/1953   Years since quitting: 69.0  Smokeless Tobacco Never    Goals Met:  Independence with exercise equipment Exercise tolerated well No report of concerns or symptoms today Strength training completed today  Goals Unmet:  Not Applicable  Comments: Pt able to follow exercise prescription today without complaint.  Will continue to monitor for progression.    Dr. Emily Filbert is Medical Director for [place].  Dr. Ottie Glazier is Medical Director for [facility] Pulmonary Rehabilitation.

## 2022-04-05 ENCOUNTER — Encounter: Payer: Medicare HMO | Admitting: *Deleted

## 2022-04-05 DIAGNOSIS — J8410 Pulmonary fibrosis, unspecified: Secondary | ICD-10-CM

## 2022-04-05 NOTE — Progress Notes (Signed)
Daily Session Note  Patient Details  Name: Sean Waters MRN: [id number] Date of Birth: 20-Mar-1941 Referring Provider:   Flowsheet Row Pulmonary Rehab from 12/16/2021 in [facility] Cardiac and Pulmonary Rehab  Referring Provider Wallene Huh MD       Encounter Date: 04/05/2022  Check In:  Session Check In - 04/05/22 1326       Check-In   Supervising physician immediately available to respond to emergencies See telemetry face sheet for immediately available ER MD    Location [facility]-Cardiac & Pulmonary Rehab    Staff Present Renita Papa, RN Odelia Gage, RN, ADN;Laureen Owens Shark, BS, RRT, CPFT    Virtual Visit No    Medication changes reported     No    Fall or balance concerns reported    No    Warm-up and Cool-down Performed on first and last piece of equipment    Resistance Training Performed Yes    VAD Patient? No    PAD/SET Patient? No      Pain Assessment   Currently in Pain? No/denies                Social History   Tobacco Use  Smoking Status Former   Packs/day: 1.00   Types: Cigarettes   Quit date: 03/17/1953   Years since quitting: 69.0  Smokeless Tobacco Never    Goals Met:  Independence with exercise equipment Exercise tolerated well No report of concerns or symptoms today Strength training completed today  Goals Unmet:  Not Applicable  Comments: Pt able to follow exercise prescription today without complaint.  Will continue to monitor for progression.    Dr. Emily Filbert is Medical Director for [place].  Dr. Ottie Glazier is Medical Director for [facility] Pulmonary Rehabilitation.

## 2022-04-12 ENCOUNTER — Encounter: Payer: Medicare HMO | Admitting: *Deleted

## 2022-04-12 DIAGNOSIS — J8410 Pulmonary fibrosis, unspecified: Secondary | ICD-10-CM

## 2022-04-12 NOTE — Progress Notes (Signed)
Daily Session Note  Patient Details  Name: Sean Waters MRN: [id number] Date of Birth: 1940/12/22 Referring Provider:   Flowsheet Row Pulmonary Rehab from 12/16/2021 in [facility] Cardiac and Pulmonary Rehab  Referring Provider Wallene Huh MD       Encounter Date: 04/12/2022  Check In:  Session Check In - 04/12/22 1332       Check-In   Supervising physician immediately available to respond to emergencies See telemetry face sheet for immediately available ER MD    Location [facility]-Cardiac & Pulmonary Rehab    Staff Present Renita Papa, RN Odelia Gage, RN, ADN;Laureen Owens Shark, BS, RRT, CPFT    Virtual Visit No    Medication changes reported     No    Fall or balance concerns reported    No    Warm-up and Cool-down Performed on first and last piece of equipment    Resistance Training Performed Yes    VAD Patient? No    PAD/SET Patient? No      Pain Assessment   Currently in Pain? No/denies                Social History   Tobacco Use  Smoking Status Former   Packs/day: 1.00   Types: Cigarettes   Quit date: 03/17/1953   Years since quitting: 69.1  Smokeless Tobacco Never    Goals Met:  Independence with exercise equipment Exercise tolerated well No report of concerns or symptoms today Strength training completed today  Goals Unmet:  Not Applicable  Comments: Pt able to follow exercise prescription today without complaint.  Will continue to monitor for progression.    Dr. Emily Filbert is Medical Director for [place].  Dr. Ottie Glazier is Medical Director for [facility] Pulmonary Rehabilitation.

## 2022-04-14 ENCOUNTER — Encounter: Payer: Medicare HMO | Admitting: *Deleted

## 2022-04-14 ENCOUNTER — Encounter: Payer: Self-pay | Admitting: *Deleted

## 2022-04-14 DIAGNOSIS — J8410 Pulmonary fibrosis, unspecified: Secondary | ICD-10-CM

## 2022-04-14 NOTE — Progress Notes (Signed)
Daily Session Note  Patient Details  Name: Sean Waters MRN: [id number] Date of Birth: 1940-06-08 Referring Provider:   Flowsheet Row Pulmonary Rehab from 12/16/2021 in [facility] Cardiac and Pulmonary Rehab  Referring Provider Wallene Huh MD       Encounter Date: 04/14/2022  Check In:  Session Check In - 04/14/22 1342       Check-In   Supervising physician immediately available to respond to emergencies See telemetry face sheet for immediately available ER MD    Location [facility]-Cardiac & Pulmonary Rehab    Staff Present Renita Papa, RN Moises Blood, BS, ACSM CEP, Exercise Physiologist;Megan Tamala Julian, RN, ADN    Virtual Visit No    Medication changes reported     No    Fall or balance concerns reported    No    Warm-up and Cool-down Performed on first and last piece of equipment    Resistance Training Performed Yes    VAD Patient? No    PAD/SET Patient? No      Pain Assessment   Currently in Pain? No/denies                Social History   Tobacco Use  Smoking Status Former   Packs/day: 1.00   Types: Cigarettes   Quit date: 03/17/1953   Years since quitting: 69.1  Smokeless Tobacco Never    Goals Met:  Independence with exercise equipment Exercise tolerated well No report of concerns or symptoms today Strength training completed today  Goals Unmet:  Not Applicable  Comments: Pt able to follow exercise prescription today without complaint.  Will continue to monitor for progression.    Dr. Emily Filbert is Medical Director for [place].  Dr. Ottie Glazier is Medical Director for [facility] Pulmonary Rehabilitation.

## 2022-04-14 NOTE — Progress Notes (Signed)
Pulmonary Individual Treatment Plan  Patient Details  Name: Sean Waters MRN: [id number] Date of Birth: 09-25-1940 Referring Provider:   Flowsheet Row Pulmonary Rehab from 12/16/2021 in [facility] Cardiac and Pulmonary Rehab  Referring Provider Wallene Huh MD       Initial Encounter Date:  Flowsheet Row Pulmonary Rehab from 12/16/2021 in [facility] Cardiac and Pulmonary Rehab  Date 12/16/21       Visit Diagnosis: Pulmonary fibrosis ([place])  Patient's Home Medications on Admission:  Current Outpatient Medications:    albuterol (VENTOLIN HFA) 108 (90 Base) MCG/ACT inhaler, Inhale 2 puffs into the lungs every 6 (six) hours as needed., Disp: , Rfl:    ALPRAZolam (XANAX) 0.25 MG tablet, Take 0.25 mg by mouth as needed for anxiety., Disp: , Rfl:    enalapril (VASOTEC) 10 MG tablet, Take 1.5 tablets by mouth daily., Disp: , Rfl:    ezetimibe-simvastatin (VYTORIN) 10-10 MG tablet, Take 1 tablet by mouth at bedtime., Disp: , Rfl:    levothyroxine (SYNTHROID) 50 MCG tablet, Take 50 mcg by mouth daily., Disp: , Rfl:    predniSONE (DELTASONE) 10 MG tablet, Take 1 tablet by mouth daily., Disp: , Rfl:    predniSONE (DELTASONE) 5 MG tablet, Take by mouth., Disp: , Rfl:    tadalafil (CIALIS) 5 MG tablet, Take by mouth., Disp: , Rfl:   Past Medical History: No past medical history on file.  Tobacco Use: Social History   Tobacco Use  Smoking Status Former   Packs/day: 1.00   Types: Cigarettes   Quit date: 03/17/1953   Years since quitting: 35.1  Smokeless Tobacco Never    Labs: Review Flowsheet        No data to display           Pulmonary Assessment Scores:  Pulmonary Assessment Scores     Row Name 12/16/21 1707         ADL UCSD   ADL Phase Entry     SOB Score total 17     Rest 0     Walk 1     Stairs 1     Bath 0     Dress 0     Shop 1       CAT Score   CAT Score 4       mMRC Score   mMRC Score 1              UCSD: Self-administered rating of dyspnea  associated with activities of daily living (ADLs) 6-point scale (0 = "not at all" to 5 = "maximal or unable to do because of breathlessness")  Scoring Scores range from 0 to 120.  Minimally important difference is 5 units  CAT: CAT can identify the health impairment of COPD patients and is better correlated with disease progression.  CAT has a scoring range of zero to 40. The CAT score is classified into four groups of low (less than 10), medium (10 - 20), high (21-30) and very high (31-40) based on the impact level of disease on health status. A CAT score over 10 suggests significant symptoms.  A worsening CAT score could be explained by an exacerbation, poor medication adherence, poor inhaler technique, or progression of COPD or comorbid conditions.  CAT MCID is 2 points  mMRC: mMRC (Modified Medical Research Council) Dyspnea Scale is used to assess the degree of baseline functional disability in patients of respiratory disease due to dyspnea. No minimal important difference is established. A decrease in score of 1 point  or greater is considered a positive change.   Pulmonary Function Assessment:   Exercise Target Goals: Exercise Program Goal: Individual exercise prescription set using results from initial 6 min walk test and THRR while considering  patient's activity barriers and safety.   Exercise Prescription Goal: Initial exercise prescription builds to 30-45 minutes a day of aerobic activity, 2-3 days per week.  Home exercise guidelines will be given to patient during program as part of exercise prescription that the participant will acknowledge.  Education: Aerobic Exercise: - Group verbal and visual presentation on the components of exercise prescription. Introduces F.I.T.T principle from ACSM for exercise prescriptions.  Reviews F.I.T.T. principles of aerobic exercise including progression. Written material given at graduation. Flowsheet Row Pulmonary Rehab from 03/24/2022 in [facility]  Cardiac and Pulmonary Rehab  Education need identified 12/16/21  Date 03/24/22  Educator Jefferson  Instruction Review Code 1- Verbalizes Understanding       Education: Resistance Exercise: - Group verbal and visual presentation on the components of exercise prescription. Introduces F.I.T.T principle from ACSM for exercise prescriptions  Reviews F.I.T.T. principles of resistance exercise including progression. Written material given at graduation.    Education: Exercise & Equipment Safety: - Individual verbal instruction and demonstration of equipment use and safety with use of the equipment. Flowsheet Row Pulmonary Rehab from 03/24/2022 in [facility] Cardiac and Pulmonary Rehab  Education need identified 12/16/21  Date 12/16/21  Educator Alexander  Instruction Review Code 1- Verbalizes Understanding       Education: Exercise Physiology & General Exercise Guidelines: - Group verbal and written instruction with models to review the exercise physiology of the cardiovascular system and associated critical values. Provides general exercise guidelines with specific guidelines to those with heart or lung disease.  Flowsheet Row Pulmonary Rehab from 03/24/2022 in [facility] Cardiac and Pulmonary Rehab  Date 01/13/22  Educator kl  Instruction Review Code 1- [employer] Understanding       Education: Flexibility, Balance, Mind/Body Relaxation: - Group verbal and visual presentation with interactive activity on the components of exercise prescription. Introduces F.I.T.T principle from ACSM for exercise prescriptions. Reviews F.I.T.T. principles of flexibility and balance exercise training including progression. Also discusses the mind body connection.  Reviews various relaxation techniques to help reduce and manage stress (i.e. Deep breathing, progressive muscle relaxation, and visualization). Balance handout provided to take home. Written material given at graduation.   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 HR 105 bpm  Max Ex. BP 152/72     2 Minute Post BP 132/76       Interval HR   1 Minute HR 79     2 Minute HR 94     3 Minute HR 105     4 Minute HR 105     5 Minute HR 100     6 Minute HR 105     2 Minute Post HR 70     Interval Heart Rate? Yes       Interval Oxygen   Interval Oxygen? Yes     Baseline Oxygen Saturation % 95 %     1 Minute Oxygen Saturation % 92 %     1 Minute Liters of Oxygen 0 L  RA     2 Minute Oxygen Saturation % 90 %     2 Minute Liters of Oxygen 0 L     3 Minute Oxygen Saturation % 88 %     3 Minute Liters of Oxygen 0 L     4 Minute Oxygen Saturation % 89 %     4 Minute Liters of Oxygen 0 L     5 Minute Oxygen Saturation % 88 %     5 Minute Liters of Oxygen 0 L     6 Minute Oxygen Saturation % 87 %     6 Minute Liters of Oxygen 0 L     2 Minute Post Oxygen Saturation % 96 %     2 Minute Post Liters of Oxygen 0 L             Oxygen Initial Assessment:  Oxygen Initial Assessment - 12/16/21 1707       Home Oxygen   Home Oxygen Device None    Sleep Oxygen Prescription CPAP    Liters per minute 5    Home Exercise Oxygen Prescription None    Home Resting Oxygen Prescription None    Compliance with Home Oxygen Use No      Initial 6 min Walk   Oxygen Used None      Program Oxygen Prescription   Program Oxygen  Prescription None      Intervention   Short Term Goals To learn and understand importance of monitoring SPO2 with pulse oximeter and demonstrate accurate use of the pulse oximeter.;To learn and understand importance of maintaining oxygen saturations>88%;To learn and demonstrate proper pursed lip breathing techniques or other breathing techniques. ;To learn and demonstrate proper use of respiratory medications    Long  Term Goals Compliance with respiratory medication;Exhibits proper breathing techniques, such as pursed lip breathing or other method taught during program session;Maintenance of O2 saturations>88%;Verbalizes importance of monitoring SPO2 with pulse oximeter and return demonstration             Oxygen Re-Evaluation:  Oxygen Re-Evaluation     Row Name 12/30/21 1353 02/15/22 1426 03/08/22 1436 04/05/22 1343       Program Oxygen Prescription   Program Oxygen Prescription None None None None      Home Oxygen   Home Oxygen Device None None None None    Sleep Oxygen Prescription CPAP CPAP CPAP CPAP    Liters per minute _0 Home Exercise Oxygen Prescription None None None None    Home Resting Oxygen Prescription None None None None    Compliance with Home Oxygen Use Yes Yes Yes Yes      Goals/Expected Outcomes   Short Term Goals To learn  and understand importance of monitoring SPO2 with pulse oximeter and demonstrate accurate use of the pulse oximeter.;To learn and understand importance of maintaining oxygen saturations>88%;To learn and demonstrate proper pursed lip breathing techniques or other breathing techniques. ;To learn and demonstrate proper use of respiratory medications To learn and understand importance of monitoring SPO2 with pulse oximeter and demonstrate accurate use of the pulse oximeter.;To learn and understand importance of maintaining oxygen saturations>88%;To learn and demonstrate proper pursed lip breathing techniques or other breathing techniques. ;To  learn and demonstrate proper use of respiratory medications To learn and understand importance of monitoring SPO2 with pulse oximeter and demonstrate accurate use of the pulse oximeter.;To learn and understand importance of maintaining oxygen saturations>88%;To learn and demonstrate proper pursed lip breathing techniques or other breathing techniques. ;To learn and demonstrate proper use of respiratory medications To learn and understand importance of monitoring SPO2 with pulse oximeter and demonstrate accurate use of the pulse oximeter.;To learn and understand importance of maintaining oxygen saturations>88%;To learn and demonstrate proper pursed lip breathing techniques or other breathing techniques. ;To learn and demonstrate proper use of respiratory medications    Long  Term Goals Compliance with respiratory medication;Exhibits proper breathing techniques, such as pursed lip breathing or other method taught during program session;Maintenance of O2 saturations>88%;Verbalizes importance of monitoring SPO2 with pulse oximeter and return demonstration Compliance with respiratory medication;Exhibits proper breathing techniques, such as pursed lip breathing or other method taught during program session;Maintenance of O2 saturations>88%;Verbalizes importance of monitoring SPO2 with pulse oximeter and return demonstration Compliance with respiratory medication;Exhibits proper breathing techniques, such as pursed lip breathing or other method taught during program session;Maintenance of O2 saturations>88%;Verbalizes importance of monitoring SPO2 with pulse oximeter and return demonstration Compliance with respiratory medication;Exhibits proper breathing techniques, such as pursed lip breathing or other method taught during program session;Maintenance of O2 saturations>88%;Verbalizes importance of monitoring SPO2 with pulse oximeter and return demonstration    Comments -- Sean Waters is good about using his CPAP each night.  He  sleeps better with it.  He is doing well on his meds.  His saturations continue to do well.  We will conitnue to check in with him. Sean Waters is good about using his CPAP each night.  He sleeps better with it.  He is doing well on his meds.  His saturations continue to do well.  He meets with a team of pulmonologists at [facility] in December to dig deeper into the status of his lung disease and hopefully get more answers. Sean Waters is doing well and has not needed to use his inhaler.  He did notice that the cooler weather has made it harder to breathe. He is trying to use his PLB.  He is staying compliant with he CPAP.  He recently changed to a full mask and still getting used to it.    Goals/Expected Outcomes -- Short: Conitnue to use meds and CPAP daily Long: conitnue to monitor saturations Short: Conitnue to use meds and CPAP daily. Have appointment with [place] pulmonologists.  Long: conitnue to monitor saturations. Short: Adjust to new mask Long: continue to use PLB             Oxygen Discharge (Final Oxygen Re-Evaluation):  Oxygen Re-Evaluation - 04/05/22 1343       Program Oxygen Prescription   Program Oxygen Prescription None      Home Oxygen   Home Oxygen Device None    Sleep Oxygen Prescription CPAP    Liters per minute 5    Home  Exercise Oxygen Prescription None    Home Resting Oxygen Prescription None    Compliance with Home Oxygen Use Yes      Goals/Expected Outcomes   Short Term Goals To learn and understand importance of monitoring SPO2 with pulse oximeter and demonstrate accurate use of the pulse oximeter.;To learn and understand importance of maintaining oxygen saturations>88%;To learn and demonstrate proper pursed lip breathing techniques or other breathing techniques. ;To learn and demonstrate proper use of respiratory medications    Long  Term Goals Compliance with respiratory medication;Exhibits proper breathing techniques, such as pursed lip breathing or other method taught during program  session;Maintenance of O2 saturations>88%;Verbalizes importance of monitoring SPO2 with pulse oximeter and return demonstration    Comments Sean Waters is doing well and has not needed to use his inhaler.  He did notice that the cooler weather has made it harder to breathe. He is trying to use his PLB.  He is staying compliant with he CPAP.  He recently changed to a full mask and still getting used to it.    Goals/Expected Outcomes Short: Adjust to new mask Long: continue to use PLB             Initial Exercise Prescription:  Initial Exercise Prescription - 12/16/21 1700       Date of Initial Exercise RX and Referring Provider   Date 12/16/21    Referring Provider Wallene Huh MD      Oxygen   Maintain Oxygen Saturation 88% or higher      Treadmill   MPH 2    Grade 0.5    Minutes 15    METs 2.53      Recumbant Elliptical   Level 1    Watts 50    Minutes 15    METs 1.9      REL-XR   Level 1    Speed 50    Minutes 15    METs 1.9      Prescription Details   Frequency (times per week) 2    Duration Progress to 30 minutes of continuous aerobic without signs/symptoms of physical distress      Intensity   THRR 40-80% of Max Heartrate 95 - 124    Ratings of Perceived Exertion 11-13    Perceived Dyspnea 0-4      Progression   Progression Continue to progress workloads to maintain intensity without signs/symptoms of physical distress.      Resistance Training   Training Prescription Yes    Weight 4 lb    Reps 10-15             Perform Capillary Blood Glucose checks as needed.  Exercise Prescription Changes:   Exercise Prescription Changes     Row Name 12/16/21 1700 12/28/21 1100 01/04/22 1500 01/11/22 1400 01/27/22 0900     Response to Exercise   Blood Pressure (Admit) 140/74 106/64 -- 124/64 138/66   Blood Pressure (Exercise) 166/74 148/68 -- 118/58 144/62   Blood Pressure (Exit) 132/76 120/70 -- 118/62 110/60   Heart Rate (Admit) 66 bpm 75 bpm -- 92 bpm  100 bpm   Heart Rate (Exercise) 105 bpm 109 bpm -- 97 bpm 106 bpm   Heart Rate (Exit) 70 bpm 89 bpm -- 90 bpm 81 bpm   Oxygen Saturation (Admit) 95 % 94 % -- 94 % 93 %   Oxygen Saturation (Exercise) 88 % 88 % -- 89 % 90 %   Oxygen Saturation (Exit) 96 % 91 % -- 94 %  91 %   Rating of Perceived Exertion (Exercise) 11 11 -- 12 11   Perceived Dyspnea (Exercise) 0 -- -- 0 0   Symptoms none none -- none none   Comments 6MWT results 2nd full day of exercise -- -- --   Duration -- Progress to 30 minutes of  aerobic without signs/symptoms of physical distress -- Continue with 30 min of aerobic exercise without signs/symptoms of physical distress. Continue with 30 min of aerobic exercise without signs/symptoms of physical distress.   Intensity -- THRR unchanged -- THRR unchanged THRR unchanged     Progression   Progression -- Continue to progress workloads to maintain intensity without signs/symptoms of physical distress. -- Continue to progress workloads to maintain intensity without signs/symptoms of physical distress. Continue to progress workloads to maintain intensity without signs/symptoms of physical distress.   Average METs -- 2.39 -- 2.46 2.6     Resistance Training   Training Prescription -- Yes -- Yes Yes   Weight -- 4 lb -- 5 lb 5 lb   Reps -- 10-15 -- 10-15 10-15     Interval Training   Interval Training -- No -- No No     Treadmill   MPH -- 2 -- 2 --   Grade -- 0.5 -- 0.5 --   Minutes -- 15 -- 15 --   METs -- 2.67 -- 2.67 --     Recumbant Bike   Level -- -- -- -- 3.5   Minutes -- -- -- -- 15   METs -- -- -- -- 3.3     NuStep   Level -- -- -- 4 6   Minutes -- -- -- 15 15   METs -- -- -- 2.2 2.2     Recumbant Elliptical   Level -- 1.4 -- 1.4 --   Minutes -- 15 -- 15 --     REL-XR   Level -- 3 -- 3 --   Minutes -- 15 -- 15 --   METs -- 2.2 -- 2 --     Biostep-RELP   Level -- -- -- 1 --   Minutes -- -- -- 15 --   METs -- -- -- 3 --     Home Exercise Plan    Plans to continue exercise at -- -- Home (comment)  recumbent bike, stepper, walking Home (comment)  recumbent bike, stepper, walking Home (comment)  recumbent bike, stepper, walking   Frequency -- -- Add 3 additional days to program exercise sessions. Add 3 additional days to program exercise sessions. Add 3 additional days to program exercise sessions.   Initial Home Exercises Provided -- -- 01/04/22 01/04/22 01/04/22     Oxygen   Maintain Oxygen Saturation -- 88% or higher 88% or higher 88% or higher 88% or higher    Row Name 02/23/22 1000 03/08/22 1600 03/22/22 1400 04/05/22 1400       Response to Exercise   Blood Pressure (Admit) 136/64 118/68 126/64 110/60    Blood Pressure (Exit) 112/56 132/80 102/70 110/64    Heart Rate (Admit) 87 bpm 80 bpm 89 bpm 80 bpm    Heart Rate (Exercise) 113 bpm 114 bpm 118 bpm 123 bpm    Heart Rate (Exit) 70 bpm 85 bpm 88 bpm 88 bpm    Oxygen Saturation (Admit) 96 % 96 % 94 % 96 %    Oxygen Saturation (Exercise) 87 % 90 % 88 % 91 %    Oxygen Saturation (Exit) 95 % 95 % 96 %  94 %    Rating of Perceived Exertion (Exercise) _0 Perceived Dyspnea (Exercise) 1 1 0 0    Symptoms none none none none    Duration Continue with 30 min of aerobic exercise without signs/symptoms of physical distress. Continue with 30 min of aerobic exercise without signs/symptoms of physical distress. Continue with 30 min of aerobic exercise without signs/symptoms of physical distress. Continue with 30 min of aerobic exercise without signs/symptoms of physical distress.    Intensity THRR unchanged THRR unchanged THRR unchanged THRR unchanged      Progression   Progression Continue to progress workloads to maintain intensity without signs/symptoms of physical distress. Continue to progress workloads to maintain intensity without signs/symptoms of physical distress. Continue to progress workloads to maintain intensity without signs/symptoms of physical distress. Continue to  progress workloads to maintain intensity without signs/symptoms of physical distress.    Average METs 3.17 3.5 3.4 --      Resistance Training   Training Prescription Yes Yes Yes Yes    Weight 5 lb 5 lb 5 lb 5 lb    Reps 10-15 10-15 10-15 10-15      Interval Training   Interval Training No No No No      Treadmill   MPH 2.5 2.5 2.5 2.5    Grade 0.5 0.5 0.5 1    Minutes _1 METs 3.09 3.09 3.2 3.26      Recumbant Bike   Level 4 3.8 8 --    Minutes _2 --    METs 2.94 3.6 3.2 --      NuStep   Level _3 --    Minutes _4 --    METs 3.1 2.9 3.1 --      REL-XR   Level _5 Minutes _6 METs 3.9 4.9 4.7 3.9      Home Exercise Plan   Plans to continue exercise at Home (comment)  recumbent bike, stepper, walking Home (comment)  recumbent bike, stepper, walking Home (comment)  recumbent bike, stepper, walking Home (comment)  recumbent bike, stepper, walking    Frequency Add 3 additional days to program exercise sessions. Add 3 additional days to program exercise sessions. Add 3 additional days to program exercise sessions. Add 3 additional days to program exercise sessions.    Initial Home Exercises Provided 01/04/22 01/04/22 01/04/22 01/04/22      Oxygen   Maintain Oxygen Saturation 88% or higher 88% or higher 88% or higher 88% or higher             Exercise Comments:   Exercise Goals and Review:   Exercise Goals     Row Name 12/16/21 1720             Exercise Goals   Increase Physical Activity Yes       Intervention Provide advice, education, support and counseling about physical activity/exercise needs.;Develop an individualized exercise prescription for aerobic and resistive training based on initial evaluation findings, risk stratification, comorbidities and participant's personal goals.       Expected Outcomes Short Term: Attend rehab on a regular basis to increase amount of physical activity.;Long Term: Add in home  exercise to make exercise part of routine and to increase amount of physical activity.;Long Term: Exercising regularly at least 3-5 days a week.       Increase Strength and  Stamina Yes       Intervention Provide advice, education, support and counseling about physical activity/exercise needs.;Develop an individualized exercise prescription for aerobic and resistive training based on initial evaluation findings, risk stratification, comorbidities and participant's personal goals.       Expected Outcomes Short Term: Increase workloads from initial exercise prescription for resistance, speed, and METs.;Short Term: Perform resistance training exercises routinely during rehab and add in resistance training at home;Long Term: Improve cardiorespiratory fitness, muscular endurance and strength as measured by increased METs and functional capacity (6MWT)       Able to understand and use rate of perceived exertion (RPE) scale Yes       Intervention Provide education and explanation on how to use RPE scale       Expected Outcomes Short Term: Able to use RPE daily in rehab to express subjective intensity level;Long Term:  Able to use RPE to guide intensity level when exercising independently       Able to understand and use Dyspnea scale Yes       Intervention Provide education and explanation on how to use Dyspnea scale       Expected Outcomes Short Term: Able to use Dyspnea scale daily in rehab to express subjective sense of shortness of breath during exertion;Long Term: Able to use Dyspnea scale to guide intensity level when exercising independently       Knowledge and understanding of Target Heart Rate Range (THRR) Yes       Intervention Provide education and explanation of THRR including how the numbers were predicted and where they are located for reference       Expected Outcomes Short Term: Able to state/look up THRR;Long Term: Able to use THRR to govern intensity when exercising independently;Short Term:  Able to use daily as guideline for intensity in rehab       Able to check pulse independently Yes       Intervention Review the importance of being able to check your own pulse for safety during independent exercise;Provide education and demonstration on how to check pulse in carotid and radial arteries.       Expected Outcomes Short Term: Able to explain why pulse checking is important during independent exercise;Long Term: Able to check pulse independently and accurately       Understanding of Exercise Prescription Yes       Intervention Provide education, explanation, and written materials on patient's individual exercise prescription       Expected Outcomes Short Term: Able to explain program exercise prescription;Long Term: Able to explain home exercise prescription to exercise independently                Exercise Goals Re-Evaluation :  Exercise Goals Re-Evaluation     Row Name 12/28/21 1159 12/30/21 1344 01/04/22 1509 01/11/22 1447 01/27/22 0950     Exercise Goal Re-Evaluation   Exercise Goals Review Increase Physical Activity;Increase Strength and Stamina;Understanding of Exercise Prescription Increase Physical Activity;Increase Strength and Stamina;Understanding of Exercise Prescription Increase Physical Activity;Increase Strength and Stamina;Understanding of Exercise Prescription Increase Physical Activity;Increase Strength and Stamina;Understanding of Exercise Prescription Increase Physical Activity;Increase Strength and Stamina;Understanding of Exercise Prescription   Comments Sean Waters is doing well for the first couple of sessions that he has been here. His oxygen did drop to 88% but he knows to be aware and to stop and PLB when he does. He worked at level 3 on the XR and RPE was appropriate. We will continue to monitor as he  progresses in the program. For exercise he will use the stepper and recumbent bike at home: he alternates the time from 1.5 hours and 30 minutes alternating days.  He uses a pulse ox to monitor his HR and O2; he keeps his HR around 100-110 during exercise, his O2 stays around 92%. Reviewed that his target HR is 95-124. Reviewed home exercise with pt today.  Pt is already exercising on his stepper and recumbent bike for exercise. He exercises about 1 hour and alternates with 30 minutes every other day. We did talk about incorporating structured walking to switch up his routine. He is not interested in taking rest days after discussing the importance. He does home exercise on top of rehab in the same day and made sure he is aware that he is not overworking his muscles and that he is also intaking the appropriate and right amount of foods. He is deffering nutrition at this time. He works up to "amount of calories" and recommended to switch more to base off of RPE and HR. He states he usually reached 100-105 bpm HR during his exercise at home.   He is watching his O2 sats at home as he drops sometime during rehab. Reviewed THR, pulse, RPE, sign and symptoms, pulse oximetery and when to call 911 or MD. Also discussed weather considerations and indoor options.  Pt voiced understanding. Sean Waters is doing well in rehab.  He is up to 5 lb hand weights and 3 METs on the BioStep.  We will continue to monitor his progress. Sean Waters is doing well in rehab. He improved to level 6 on the T4. He also began using the recumbent bike and did well with level 3.5. He also increased his overall average MET levelt o 2.6 METs. We will continue to monitor his progress in the program.   Expected Outcomes Short: Continue current exercise prescription and watch O2 closely Long: Increase overall MET level Short: Continue current exercise prescription and watch O2 closely Long: Increase overall MET level Short: Watch O2 closely at home, incorporate some walking to switch up routine Long: Exercise independently at home at appropriate prescription Short: Begin to increase workloads Long: Continue to improve stamina  Short: Continue to increase workloads Long: Continue to improve strength and stamina    Row Name 02/09/22 1547 02/15/22 1415 02/23/22 1037 03/08/22 1424 03/08/22 1610     Exercise Goal Re-Evaluation   Exercise Goals Review Increase Physical Activity;Increase Strength and Stamina;Understanding of Exercise Prescription Increase Physical Activity;Increase Strength and Stamina;Understanding of Exercise Prescription Increase Physical Activity;Increase Strength and Stamina;Understanding of Exercise Prescription Increase Physical Activity;Increase Strength and Stamina;Understanding of Exercise Prescription Increase Physical Activity;Increase Strength and Stamina;Understanding of Exercise Prescription   Comments Sean Waters has not been here since last reivew as he has been out of town. We hope to see his attendance improve as returns back on 9/27. Sean Waters returned today.  He is feeling good with his exercise. He is walking and building a wall in his bathroom on his off days to keep up with his exercise.  He does feel like his strength and stamina are starting to recover. Sean Waters is doing well with exercise. He recently increased his overall average MET level to 3.17 METs. He also improved to level 4 on the recumbent bike and level 9 on the XR. He has tolerated the treadmill at a speed of 2.5 mph and an incline of 0.5% as well. We will continue to monitor his progress in the program. Sean Waters reports that he  feels like he is doing well and feeling like he is breathing better and is stronger. He reports that he exercises at home almost every day using his stepper and recumbant bike and following the home exercise guidelines provided to him by the pumonary rehab program. He also attends rehab classes consistently and continues to make progress with his workloads. He feels that his pulse ox at home does not read accurately when he is exercising, so it was recommended to him to bring it into class and make sure it is consistently reading with  the pulse ox he wears in class. Sean Waters is doing well in rehab. He did go back to level 6 on the T4 Nustep. He also worked up to almost 5 METS on the XR! Oxygen saturations are staying above 88%. He would benefit from increasing more incline on the treadmill. We will continue to monitor.   Expected Outcomes Short: Maintain routine attendance Long: Finish [facility] Program Short: Continue to exericse on off days Long: conitnue to improve stamina Short: Continue to increase workload on the treadmill. Long: conitnue to improve stamina Short: check accuracy of pulse ox he uses at home. Long: continue to improve stamina and become independent with exercise routine, while monitoring oxygen satruations. Short: Increase incline on treadmill Long: Continue to increase overall MET level    Row Name 03/22/22 1447 04/05/22 1341 04/05/22 1436         Exercise Goal Re-Evaluation   Exercise Goals Review Increase Physical Activity;Increase Strength and Stamina;Understanding of Exercise Prescription Increase Physical Activity;Increase Strength and Stamina;Understanding of Exercise Prescription Increase Physical Activity;Increase Strength and Stamina;Understanding of Exercise Prescription     Comments Sean Waters is doing well in rehab. He has consistently kept his MET level above 3 METs. He has also improved to level 8 on the recumbent bike and continued with level 8 on the XR. We will continue to monitor his progress in the program. Sean Waters is doing well in rehab.  He is feeling good with his exercise.  On his off days he is exercise for an hour and an additional half hour on the days he comes to rehab.  He does feel like his strength and stamina are getting better.  He feels like he is able to do more before getting too SOB. Sean Waters continues to do well in rehab. He increased to  a 1% incline on the treadmill and was able to work over 3.2 METS.  He has been consistently working at level 8 on the XR which he tolerates well.  He is reaching  his THR each session. Will continue to monitor.     Expected Outcomes Short: Continue to increase workloads as tolerated. Long: Continue to increase strength and stamina. Short: continue to exericse on off days Long: Continue to exercise indpendently Short: Continue to slowly increase treadmill workload  Long: Continue to increase overall MET level              Discharge Exercise Prescription (Final Exercise Prescription Changes):  Exercise Prescription Changes - 04/05/22 1400       Response to Exercise   Blood Pressure (Admit) 110/60    Blood Pressure (Exit) 110/64    Heart Rate (Admit) 80 bpm    Heart Rate (Exercise) 123 bpm    Heart Rate (Exit) 88 bpm    Oxygen Saturation (Admit) 96 %    Oxygen Saturation (Exercise) 91 %    Oxygen Saturation (Exit) 94 %    Rating of Perceived Exertion (Exercise) 11  Perceived Dyspnea (Exercise) 0    Symptoms none    Duration Continue with 30 min of aerobic exercise without signs/symptoms of physical distress.    Intensity THRR unchanged      Progression   Progression Continue to progress workloads to maintain intensity without signs/symptoms of physical distress.      Resistance Training   Training Prescription Yes    Weight 5 lb    Reps 10-15      Interval Training   Interval Training No      Treadmill   MPH 2.5    Grade 1    Minutes 15    METs 3.26      REL-XR   Level 8    Minutes 15    METs 3.9      Home Exercise Plan   Plans to continue exercise at Home (comment)   recumbent bike, stepper, walking   Frequency Add 3 additional days to program exercise sessions.    Initial Home Exercises Provided 01/04/22      Oxygen   Maintain Oxygen Saturation 88% or higher             Nutrition:  Target Goals: Understanding of nutrition guidelines, daily intake of sodium <1540m, cholesterol <2054m calories 30% from fat and 7% or less from saturated fats, daily to have 5 or more servings of fruits and  vegetables.  Education: All About Nutrition: -Group instruction provided by verbal, written material, interactive activities, discussions, models, and posters to present general guidelines for heart healthy nutrition including fat, fiber, MyPlate, the role of sodium in heart healthy nutrition, utilization of the nutrition label, and utilization of this knowledge for meal planning. Follow up email sent as well. Written material given at graduation.   Biometrics:  Pre Biometrics - 12/16/21 1708       Pre Biometrics   Height _0  (1.626 m)    Weight 185 lb 3.2 oz (84 kg)    BMI (Calculated) 31.77    Single Leg Stand 24.3 seconds              Nutrition Therapy Plan and Nutrition Goals:  Nutrition Therapy & Goals - 02/15/22 1418       Nutrition Therapy   RD appointment deferred Yes             Nutrition Assessments:  MEDIFICTS Score Key: ?70 Need to make dietary changes  40-70 Heart Healthy Diet ? 40 Therapeutic Level Cholesterol Diet  [facility]  Picture Your Plate Total Score on Admission 68      Picture Your Plate Scores: <4<57nhealthy dietary pattern with much room for improvement. 41-50 Dietary pattern unlikely to meet recommendations for good health and room for improvement. 51-60 More healthful dietary pattern, with some room for improvement.  >60 Healthy dietary pattern, although there may be some specific behaviors that could be improved.   Nutrition Goals Re-Evaluation:  Nutrition Goals Re-Evaluation     [place] 02/15/22 1419 03/08/22 1430 04/05/22 1347         Goals   Nutrition Goal Continues to defer appointments Continues to defer appointments Continues to defer appointments     Comment Sean Waters is feeling good about his diet. Sean Waters is feeling good about his diet. Sean Waters is feeling good about his diet.  He continues to watch his sugar and intake and limits his snacking.  His guilty  pleasure is peanuts!!  He tries to  be good.     Expected Outcome Continue to focus on healthy eating. Continue to focus on healthy eating. Continue to focus on healthy eating              Nutrition Goals Discharge (Final Nutrition Goals Re-Evaluation):  Nutrition Goals Re-Evaluation - 04/05/22 1347       Goals   Nutrition Goal Continues to defer appointments    Comment Sean Waters is feeling good about his diet.  He continues to watch his sugar and intake and limits his snacking.  His guilty pleasure is peanuts!!  He tries to be good.    Expected Outcome Continue to focus on healthy eating             Psychosocial: Target Goals: Acknowledge presence or absence of significant depression and/or stress, maximize coping skills, provide positive support system. Participant is able to verbalize types and ability to use techniques and skills needed for reducing stress and depression.   Education: Stress, Anxiety, and Depression - Group verbal and visual presentation to define topics covered.  Reviews how body is impacted by stress, anxiety, and depression.  Also discusses healthy ways to reduce stress and to treat/manage anxiety and depression.  Written material given at graduation. Flowsheet Row Pulmonary Rehab from 03/24/2022 in [facility] and Pulmonary Rehab  Date 03/10/22  Educator [place]  Instruction Review Code 1- Verbalizes Understanding       Education: Sleep Hygiene -Provides group verbal and written instruction about how sleep can affect your health.  Define sleep hygiene, discuss sleep cycles and impact of sleep habits. Review good sleep hygiene tips.    Initial Review & Psychosocial Screening:  Initial Psych Review & Screening - 12/03/21 1329       Initial Review   Current issues with Current Stress Concerns    Source of Stress Concerns Chronic Illness    Comments He reports feeling stress with his diagnosis. He has called a patient advocate and told them what Dr. Raul Del  said about his prognosis and that he didn't feel like he was supported; he didn't have someone on staff to talk to after being given his prognosis. When he got home he was depressed, lost 15 lbs, and thought he was going to die. He was looking for guidance and didn't feel like he got it - he spoke to patient relations and did not get a response. He reports that he was diagnosed in March and he was the one to ask for a referall to rehab.      Family Dynamics   Good Support System? 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     Barriers   Psychosocial barriers to participate in program The patient should benefit from training in stress management and relaxation.      Screening Interventions   Interventions Encouraged to exercise;Provide feedback about the scores to participant;To provide support and resources with identified psychosocial needs    Expected Outcomes Short Term goal: Utilizing psychosocial counselor, staff and physician to assist with identification of specific Stressors or current issues interfering with healing process. Setting desired goal for each stressor or current issue identified.;Long Term Goal: Stressors or current issues are controlled or eliminated.;Short Term goal: Identification and review with participant of any Quality of Life or Depression concerns found by scoring the questionnaire.;Long Term goal: The participant improves quality of Life and PHQ9 Scores as seen by post  scores and/or verbalization of changes             Quality of Life Scores:  Scores of 19 and below usually indicate a poorer quality of life in these areas.  A difference of  2-3 points is a clinically meaningful difference.  A difference of 2-3 points in the total score of the Quality of Life Index has been associated with significant improvement in overall quality of life, self-image, physical symptoms, and general health in studies  assessing change in quality of life.  PHQ-9: Review Flowsheet       12/16/2021  Depression screen PHQ 2/9  Decreased Interest 0  Down, Depressed, Hopeless 0  PHQ - 2 Score 0  Altered sleeping 0  Tired, decreased energy 1  Change in appetite 0  Feeling bad or failure about yourself  0  Trouble concentrating 0  Moving slowly or fidgety/restless 0  Suicidal thoughts 0  PHQ-9 Score 1  Difficult doing work/chores Somewhat difficult   Interpretation of Total Score  Total Score Depression Severity:  1-4 = Minimal depression, 5-9 = Mild depression, 10-14 = Moderate depression, 15-19 = Moderately severe depression, 20-27 = Severe depression   Psychosocial Evaluation and Intervention:  Psychosocial Evaluation - 12/03/21 1433       Psychosocial Evaluation & Interventions   Interventions Stress management education;Relaxation education;Encouraged to exercise with the program and follow exercise prescription    Comments He reports feeling stress with his diagnosis. He has called a patient advocate and told them what Dr. Raul Del said about his prognosis and that he didn't feel like he was supported; he didn't have someone on staff to talk to after being given his prognosis. When he got home he was depressed, lost 15 lbs, and thought he was going to die. He was looking for guidance and didn't feel like he got it - he spoke to patient relations and did not get a response. He reports that he was diagnosed in March and he was the one to ask for a referall to rehab. 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 He also has two children and 6 grandchildren in [place] who he visits. He has been retired and is still working on relaxing in the afternoon instead of being productive; in the mornings he exercises and plays the piano. For exercise he will use the stepper and recumbent bike at home: he  alternates the time from 1.5 hours and 30 minutes alternating days. He uses a pulse ox to monitor his HR and O2; he keeps his HR around 100 during exercise. He is excited to start Pulmonary Rehab.    Expected Outcomes ST: attend all scheduled exercise/education appointments, progress with exercise prescription while maintaining SpO2 <88%  LT: Improve shortness of breath with ADLs    Continue Psychosocial Services  Follow up required by staff             Psychosocial Re-Evaluation:  Psychosocial Re-Evaluation     [place] Name 12/30/21 1343 12/30/21 1403 02/15/22 1416 03/08/22 1430 04/05/22 1349     Psychosocial Re-Evaluation   Current issues with Current Stress Concerns -- Current Stress Concerns Current Stress Concerns None Identified;Current Stress Concerns   Comments Sean Waters reports that his wife is also his patient advocate when he goes to the doctor and he is a Occupational psychologist of the [employer] to help  with support. He also has two children and 6 grandchildren in [place] who he visits. He has been retired and is still working on relaxing in the afternoon instead of being productive; in the mornings he exercises and plays the piano. He is excited as he is going to audit a jazz ensemble class soon at [place] and he is going to [place] to play piano. He feels better about his diagnosis since being on medications and starting rehab as he feels it is helping. he is still upset with how he feels like he was treated when given his diagnosis and would like to go back in person to talk to someone about it. -- Sean Waters is doing well in rehab.  He was out for a bit, but now doing well.  He feels good overall.  He is sleeping good for most part.  He is good about using his CPAP each night.  He will get up once, with one trip to bathroom.  He is usually able to get back to sleep.  He is still playing the piano as an outlet. Patient reports no new changes in stress, sleep, or mental health concerns. He reports that we  continues to wear his CPAP and sleeps well most nights. Sean Waters is doing well in rehab.  He is consistent with using his CPAP at night and has recently switched to a full mask that he is gettting used to.  He is doing well mentally for most part.  He has down days, but tries to stay positive and do with what he's got.  He is looking forward to meeting with pulmonolgist next month for full work up.  He is learning his limits.  He is pleased with staying on course and feeling good again.  He no longer feels like he is looking at a death sentence.   Expected Outcomes ST: continue to attend rehab for mental health boost LT: continue to engage in stress reducing activites -- Short: Continue to use piano as a stress release Long: continue to stay positive Short: Continue to use piano as a stress release, and continue to use CPAP to aid in quality sleep.  Long: continue to stay positive Short: Continue to take each day at a time and look forward to pulmonolgy work up.  Long: continue to stay positive   Interventions Encouraged to attend Pulmonary Rehabilitation for the exercise -- Encouraged to attend Pulmonary Rehabilitation for the exercise Encouraged to attend Pulmonary Rehabilitation for the exercise Encouraged to attend Pulmonary Rehabilitation for the exercise   Continue Psychosocial Services  Follow up required by staff -- -- Follow up required by staff Follow up required by staff            Psychosocial Discharge (Final Psychosocial Re-Evaluation):  Psychosocial Re-Evaluation - 04/05/22 1349       Psychosocial Re-Evaluation   Current issues with None Identified;Current Stress Concerns    Comments Sean Waters is doing well in rehab.  He is consistent with using his CPAP at night and has recently switched to a full mask that he is gettting used to.  He is doing well mentally for most part.  He has down days, but tries to stay positive and do with what he's got.  He is looking forward to meeting with pulmonolgist  next month for full work up.  He is learning his limits.  He is pleased with staying on course and feeling good again.  He no longer feels like he is looking at  a death sentence.    Expected Outcomes Short: Continue to take each day at a time and look forward to pulmonolgy work up.  Long: continue to stay positive    Interventions Encouraged to attend Pulmonary Rehabilitation for the exercise    Continue Psychosocial Services  Follow up required by staff             Education: Education Goals: Education classes will be provided on a weekly basis, covering required topics. Participant will state understanding/return demonstration of topics presented.  Learning Barriers/Preferences:   General Pulmonary Education Topics:  Infection Prevention: - Provides verbal and written material to individual with discussion of infection control including proper hand washing and proper equipment cleaning during exercise session. Flowsheet Row Pulmonary Rehab from 03/24/2022 in [facility] and Pulmonary Rehab  Education need identified 12/16/21  Date 12/16/21  Educator [place]  Instruction Review Code 1- Verbalizes Understanding       Falls Prevention: - Provides verbal and written material to individual with discussion of falls prevention and safety. Flowsheet Row Pulmonary Rehab from 12/03/2021 in [facility]iac and Pulmonary Rehab  Education need identified 12/03/21  Date 12/03/21  Educator [place]  Instruction Review Code 1- Verbalizes Understanding       Chronic Lung Disease Review: - Group verbal instruction with posters, models, PowerPoint presentations and videos,  to review new updates, new respiratory medications, new advancements in procedures and treatments. Providing information on websites and "800" numbers for continued self-education. Includes information about supplement oxygen, available portable oxygen systems, continuous and intermittent flow rates, oxygen safety, concentrators, and  Medicare reimbursement for oxygen. Explanation of Pulmonary Drugs, including class, frequency, complications, importance of spacers, rinsing mouth after steroid MDI's, and proper cleaning methods for nebulizers. Review of basic lung anatomy and physiology related to function, structure, and complications of lung disease. Review of risk factors. Discussion about methods for diagnosing sleep apnea and types of masks and machines for OSA. Includes a review of the use of types of environmental controls: home humidity, furnaces, filters, dust mite/pet prevention, HEPA vacuums. Discussion about weather changes, air quality and the benefits of nasal washing. Instruction on Warning signs, infection symptoms, calling MD promptly, preventive modes, and value of vaccinations. Review of effective airway clearance, coughing and/or vibration techniques. Emphasizing that all should Create an Action Plan. Written material given at graduation. Flowsheet Row Pulmonary Rehab from 03/24/2022 in [facility] Cardiac and Pulmonary Rehab  Education need identified 12/16/21  Date 12/30/21  Educator [facility]  Instruction Review Code 1- Verbalizes Understanding       AED/CPR: - Group verbal and written instruction with the use of models to demonstrate the basic use of the AED with the basic ABC's of resuscitation.    Anatomy and Cardiac Procedures: - Group verbal and visual presentation and models provide information about basic cardiac anatomy and function. Reviews the testing methods done to diagnose heart disease and the outcomes of the test results. Describes the treatment choices: Medical Management, Angioplasty, or Coronary Bypass Surgery for treating various heart conditions including Myocardial Infarction, Angina, Valve Disease, and Cardiac Arrhythmias.  Written material given at graduation.   Medication Safety: - Group verbal and visual instruction to review commonly prescribed medications for heart and lung disease. Reviews  the medication, class of the drug, and side effects. Includes the steps to properly store meds and maintain the prescription regimen.  Written material given at graduation.   Other: -Provides group and verbal instruction on various topics (see comments)   Knowledge Questionnaire  Score:  Knowledge Questionnaire Score - 12/16/21 1706       Knowledge Questionnaire Score   Pre Score 13/18              Core Components/Risk Factors/Patient Goals at Admission:  Personal Goals and Risk Factors at Admission - 12/16/21 1722       Core Components/Risk Factors/Patient Goals on Admission    Weight Management Yes;Weight Loss    Intervention Weight Management: Develop a combined nutrition and exercise program designed to reach desired caloric intake, while maintaining appropriate intake of nutrient and fiber, sodium and fats, and appropriate energy expenditure required for the weight goal.;Weight Management: Provide education and appropriate resources to help participant work on and attain dietary goals.;Weight Management/Obesity: Establish reasonable short term and long term weight goals.    Admit Weight 185 lb (83.9 kg)    Goal Weight: Short Term 180 lb (81.6 kg)    Goal Weight: Long Term 175 lb (79.4 kg)    Expected Outcomes Short Term: Continue to assess and modify interventions until short term weight is achieved;Long Term: Adherence to nutrition and physical activity/exercise program aimed toward attainment of established weight goal;Understanding recommendations for meals to include 15-35% energy as protein, 25-35% energy from fat, 35-60% energy from carbohydrates, less than 227m of dietary cholesterol, 20-35 gm of total fiber daily;Understanding of distribution of calorie intake throughout the day with the consumption of 4-5 meals/snacks;Weight Loss: Understanding of general recommendations for a balanced deficit meal plan, which promotes 1-2 lb weight loss per week and includes a negative  energy balance of [phone] kcal/d    Improve shortness of breath with ADL's Yes    Intervention Provide education, individualized exercise plan and daily activity instruction to help decrease symptoms of SOB with activities of daily living.    Expected Outcomes Short Term: Improve cardiorespiratory fitness to achieve a reduction of symptoms when performing ADLs;Long Term: Be able to perform more ADLs without symptoms or delay the onset of symptoms    Increase knowledge of respiratory medications and ability to use respiratory devices properly  Yes    Intervention Provide education and demonstration as needed of appropriate use of medications, inhalers, and oxygen therapy.    Expected Outcomes Short Term: Achieves understanding of medications use. Understands that oxygen is a medication prescribed by physician. Demonstrates appropriate use of inhaler and oxygen therapy.;Long Term: Maintain appropriate use of medications, inhalers, and oxygen therapy.    Hypertension Yes    Intervention Provide education on lifestyle modifcations including regular physical activity/exercise, weight management, moderate sodium restriction and increased consumption of fresh fruit, vegetables, and low fat dairy, alcohol moderation, and smoking cessation.;Monitor prescription use compliance.    Expected Outcomes Short Term: Continued assessment and intervention until BP is < 140/944mHG in hypertensive participants. < 130/8045mG in hypertensive participants with diabetes, heart failure or chronic kidney disease.;Long Term: Maintenance of blood pressure at goal levels.    Lipids Yes    Intervention Provide education and support for participant on nutrition & aerobic/resistive exercise along with prescribed medications to achieve LDL <5m38mDL >40mg28m Expected Outcomes Short Term: Participant states understanding of desired cholesterol values and is compliant with medications prescribed. Participant is following exercise  prescription and nutrition guidelines.;Long Term: Cholesterol controlled with medications as prescribed, with individualized exercise RX and with personalized nutrition plan. Value goals: LDL < 5mg,67m > 40 mg.             Education:Diabetes - Individual verbal and  written instruction to review signs/symptoms of diabetes, desired ranges of glucose level fasting, after meals and with exercise. Acknowledge that pre and post exercise glucose checks will be done for 3 sessions at entry of program.   Know Your Numbers and Heart Failure: - Group verbal and visual instruction to discuss disease risk factors for cardiac and pulmonary disease and treatment options.  Reviews associated critical values for Overweight/Obesity, Hypertension, Cholesterol, and Diabetes.  Discusses basics of heart failure: signs/symptoms and treatments.  Introduces Heart Failure Zone chart for action plan for heart failure.  Written material given at graduation. Flowsheet Row Pulmonary Rehab from 03/24/2022 in [facility] Cardiac and Pulmonary Rehab  Date 12/23/21  Educator SB  Instruction Review Code 1- Verbalizes Understanding       Core Components/Risk Factors/Patient Goals Review:   Goals and Risk Factor Review     Row Name 12/30/21 1346 02/15/22 1419 03/08/22 1432 04/05/22 1345       Core Components/Risk Factors/Patient Goals Review   Personal Goals Review Weight Management/Obesity;Hypertension;Improve shortness of breath with ADL's;Lipids Weight Management/Obesity;Hypertension;Improve shortness of breath with ADL's;Lipids Weight Management/Obesity;Hypertension;Improve shortness of breath with ADL's;Lipids Weight Management/Obesity;Hypertension;Improve shortness of breath with ADL's;Lipids;Increase knowledge of respiratory medications and ability to use respiratory devices properly.    Review Sean Waters feels he is doing well in rehab so far. He continues to take his medications as directed without issues. He doesn't check his  BP at home often, but he has a BP cuff; encouraged him to check BP at home at least 1-2x/week. Sean Waters reports his shortness of breath has been doing well, but the heat can make it difficult. Sean Waters is doing well.  He is doing well with his breathing and good about using his inhalers.  We reviewed what the purpose for each one was.  His pressures are doing well and he continues to monitor it at home.  He did have a high reading at the doctor's office, but it's been good at other place. He got good results with his 6MWT in the office. He decreased his prednisone to 10 mg.  Sean Waters is doing well with his weight as well. Sean Waters's weight has been maintained. He feels that he is improving with his SOB and while he is weighting to meet with a team of pulmonologists at [facility] in [place] he is at least feeling better then he was. He is hoping to get more answers once he meets with these new doctors regarding the status of his lung disease. He continues to take all meds a prescribed. Sean Waters is doing well in rehab. He is getting better with he breathing. He has not needed his inhaler but is compliant with his CPAP.  His pressures are doing well here and at home.  His weight is staying steady.  He would like to lose and continues to work on it.    Expected Outcomes ST: check BP at home 1-2x/week  LT: monitor risk factors Short: conitnue to montior bp Long: Continue to montior risk facros. Short: meet with team of pumonologists at [facility] to get more answers about status of lung disease. Long: continue to monitor risk factors. Short: Conitnue to work on weight loss Long: conitnue to monitor risk factors.             Core Components/Risk Factors/Patient Goals at Discharge (Final Review):   Goals and Risk Factor Review - 04/05/22 1345       Core Components/Risk Factors/Patient Goals Review   Personal Goals Review Weight Management/Obesity;Hypertension;Improve shortness of  breath with ADL's;Lipids;Increase knowledge of respiratory  medications and ability to use respiratory devices properly.    Review Sean Waters is doing well in rehab. He is getting better with he breathing. He has not needed his inhaler but is compliant with his CPAP.  His pressures are doing well here and at home.  His weight is staying steady.  He would like to lose and continues to work on it.    Expected Outcomes Short: Conitnue to work on weight loss Long: conitnue to monitor risk factors.             ITP Comments:  ITP Comments     Row Name 12/03/21 1430 12/16/21 1705 12/23/21 0955 01/20/22 1344 02/17/22 0754   ITP Comments Virtual orientation call completed today. he has an appointment on Date: 12/16/21  for EP eval and gym Orientation.  Documentation of diagnosis can be found in [facility] Date: 10/28/21 . Completed 6MWT and gym orientation. Initial ITP created and sent for review to Dr. Ottie Glazier, Medical Director. 30 Day review completed. Medical Director ITP review done, changes made as directed, and signed approval by Medical Director.    NEW 30 Day review completed. Medical Director ITP review done, changes made as directed, and signed approval by Medical Director. 30 Day review completed. Medical Director ITP review done, changes made as directed, and signed approval by Medical Director.    [place] Name 03/17/22 1026 04/14/22 0944         ITP Comments 30 Day review completed. Medical Director ITP review done, changes made as directed, and signed approval by Medical Director. 30 Day review completed. Medical Director ITP review done, changes made as directed, and signed approval by Medical Director.               Comments:

## 2022-04-19 ENCOUNTER — Encounter: Payer: Medicare HMO | Attending: Specialist | Admitting: *Deleted

## 2022-04-19 DIAGNOSIS — J8410 Pulmonary fibrosis, unspecified: Secondary | ICD-10-CM | POA: Diagnosis present

## 2022-04-19 NOTE — Progress Notes (Signed)
Daily Session Note  Patient Details  Name: Deroy Noah MRN: [id number] Date of Birth: 20-Aug-1940 Referring Provider:   Flowsheet Row Pulmonary Rehab from 12/16/2021 in [facility] Cardiac and Pulmonary Rehab  Referring Provider Wallene Huh MD       Encounter Date: 04/19/2022  Check In:  Session Check In - 04/19/22 1402       Check-In   Supervising physician immediately available to respond to emergencies See telemetry face sheet for immediately available ER MD    Location [facility]-Cardiac & Pulmonary Rehab    Staff Present Renita Papa, RN Odelia Gage, RN, ADN;Jessica Luan Pulling, MA, RCEP, CCRP, CCET;[place], [place]    Virtual Visit No    Medication changes reported     No    Fall or balance concerns reported    No    Warm-up and Cool-down Performed on first and last piece of equipment    Resistance Training Performed Yes    VAD Patient? No    PAD/SET Patient? No      Pain Assessment   Currently in Pain? No/denies                Social History   Tobacco Use  Smoking Status Former   Packs/day: 1.00   Types: Cigarettes   Quit date: 03/17/1953   Years since quitting: 69.1  Smokeless Tobacco Never    Goals Met:  Independence with exercise equipment Exercise tolerated well No report of concerns or symptoms today Strength training completed today  Goals Unmet:  Not Applicable  Comments: Pt able to follow exercise prescription today without complaint.  Will continue to monitor for progression.    Dr. Emily Filbert is Medical Director for [place].  Dr. Ottie Glazier is Medical Director for [facility] Pulmonary Rehabilitation.

## 2022-04-21 ENCOUNTER — Encounter: Payer: Medicare HMO | Admitting: *Deleted

## 2022-04-21 DIAGNOSIS — J8410 Pulmonary fibrosis, unspecified: Secondary | ICD-10-CM

## 2022-04-21 NOTE — Progress Notes (Signed)
Daily Session Note  Patient Details  Name: Sean Waters MRN: [id number] Date of Birth: August 16, 1940 Referring Provider:   Flowsheet Row Pulmonary Rehab from 12/16/2021 in [facility] Cardiac and Pulmonary Rehab  Referring Provider Wallene Huh MD       Encounter Date: 04/21/2022  Check In:  Session Check In - 04/21/22 1342       Check-In   Supervising physician immediately available to respond to emergencies See telemetry face sheet for immediately available ER MD    Location [facility]-Cardiac & Pulmonary Rehab    Staff Present Alberteen Sam, MA, RCEP, CCRP, CCET;Azul Coffie Manderson, RN Moises Blood, BS, ACSM CEP, Exercise Physiologist    Virtual Visit No    Medication changes reported     No    Fall or balance concerns reported    No    Warm-up and Cool-down Performed on first and last piece of equipment    Resistance Training Performed Yes    VAD Patient? No    PAD/SET Patient? No      Pain Assessment   Currently in Pain? No/denies                Social History   Tobacco Use  Smoking Status Former   Packs/day: 1.00   Types: Cigarettes   Quit date: 03/17/1953   Years since quitting: 69.1  Smokeless Tobacco Never    Goals Met:  Independence with exercise equipment Exercise tolerated well No report of concerns or symptoms today Strength training completed today  Goals Unmet:  Not Applicable  Comments: Pt able to follow exercise prescription today without complaint.  Will continue to monitor for progression.    Dr. Emily Filbert is Medical Director for [facility].  Dr. Ottie Glazier is Medical Director for [facility] Pulmonary Rehabilitation.

## 2022-04-26 ENCOUNTER — Encounter: Payer: Medicare HMO | Admitting: *Deleted

## 2022-04-26 VITALS — Ht 64.0 in | Wt 187.7 lb

## 2022-04-26 DIAGNOSIS — J8410 Pulmonary fibrosis, unspecified: Secondary | ICD-10-CM

## 2022-04-26 NOTE — Progress Notes (Signed)
Daily Session Note  Patient Details  Name: Sean Waters MRN: [id number] Date of Birth: 18-Oct-1940 Referring Provider:   Flowsheet Row Pulmonary Rehab from 12/16/2021 in [facility] Cardiac and Pulmonary Rehab  Referring Provider Wallene Huh MD       Encounter Date: 04/26/2022  Check In:  Session Check In - 04/26/22 1336       Check-In   Supervising physician immediately available to respond to emergencies See telemetry face sheet for immediately available ER MD    Location [facility]-Cardiac & Pulmonary Rehab    Staff Present Renita Papa, RN BSN;Megan Tamala Julian, RN, Terie Purser, RCP,RRT,BSRT    Virtual Visit No    Medication changes reported     No    Fall or balance concerns reported    No    Warm-up and Cool-down Performed on first and last piece of equipment    Resistance Training Performed Yes    VAD Patient? No    PAD/SET Patient? No      Pain Assessment   Currently in Pain? No/denies                Social History   Tobacco Use  Smoking Status Former   Packs/day: 1.00   Types: Cigarettes   Quit date: 03/17/1953   Years since quitting: 69.1  Smokeless Tobacco Never    Goals Met:  Independence with exercise equipment Exercise tolerated well No report of concerns or symptoms today Strength training completed today  Goals Unmet:  Not Applicable  Comments: Pt able to follow exercise prescription today without complaint.  Will continue to monitor for progression.    Dr. Emily Filbert is Medical Director for [place].  Dr. Ottie Glazier is Medical Director for [facility] Pulmonary Rehabilitation.

## 2022-04-26 NOTE — Patient Instructions (Addendum)
Discharge Patient Instructions  Patient Details  Name: Sean Waters MRN: [id number] Date of Birth: [birth year] Referring Provider:  Baxter Hire, MD   Number of Visits: 36  Reason for Discharge:  Patient reached a stable level of exercise. Patient independent in their exercise. Patient has met program and personal goals.  Smoking History:  Social History   Tobacco Use  Smoking Status Former   Packs/day: 1.00   Types: Cigarettes   Quit date: 03/17/1953   Years since quitting: 61.1  Smokeless Tobacco Never    Diagnosis:  Pulmonary fibrosis ([place])  Initial Exercise Prescription:  Initial Exercise Prescription - 12/16/21 1700       Date of Initial Exercise RX and Referring Provider   Date 12/16/21    Referring Provider Wallene Huh MD      Oxygen   Maintain Oxygen Saturation 88% or higher      Treadmill   MPH 2    Grade 0.5    Minutes 15    METs 2.53      Recumbant Elliptical   Level 1    Watts 50    Minutes 15    METs 1.9      REL-XR   Level 1    Speed 50    Minutes 15    METs 1.9      Prescription Details   Frequency (times per week) 2    Duration Progress to 30 minutes of continuous aerobic without signs/symptoms of physical distress      Intensity   THRR 40-80% of Max Heartrate 95 - 124    Ratings of Perceived Exertion 11-13    Perceived Dyspnea 0-4      Progression   Progression Continue to progress workloads to maintain intensity without signs/symptoms of physical distress.      Resistance Training   Training Prescription Yes    Weight 4 lb    Reps 10-15             Discharge Exercise Prescription (Final Exercise Prescription Changes):  Exercise Prescription Changes - 04/20/22 1400       Response to Exercise   Blood Pressure (Admit) 140/60    Blood Pressure (Exit) 128/60    Heart Rate (Admit) 88 bpm    Heart Rate (Exercise) 126 bpm    Heart Rate (Exit) 90 bpm    Oxygen Saturation (Admit) 92 %    Oxygen Saturation  (Exercise) 88 %    Oxygen Saturation (Exit) 94 %    Rating of Perceived Exertion (Exercise) 13    Perceived Dyspnea (Exercise) 0    Symptoms none    Duration Continue with 30 min of aerobic exercise without signs/symptoms of physical distress.    Intensity THRR unchanged      Progression   Progression Continue to progress workloads to maintain intensity without signs/symptoms of physical distress.    Average METs 2.61      Resistance Training   Training Prescription Yes    Weight 5 lb    Reps 10-15      Interval Training   Interval Training No      Treadmill   MPH 2.5    Grade 0.5    Minutes 15    METs 3.09      REL-XR   Level 8    Minutes 15    METs 2.2      Home Exercise Plan   Plans to continue exercise at Home (comment)   recumbent bike, stepper, walking  Frequency Add 3 additional days to program exercise sessions.    Initial Home Exercises Provided 01/04/22      Oxygen   Maintain Oxygen Saturation 88% or higher             Functional Capacity:  6 Minute Walk     Row Name 12/16/21 1714 04/26/22 1419       6 Minute Walk   Phase Initial Discharge    Distance 1090 feet 1400 feet    Distance % Change -- 28.4 %    Distance Feet Change -- 310 ft    Walk Time 6 minutes 6 minutes    # of Rest Breaks 0 0    MPH 2.06 2.65    METS 1.97 2.67    RPE 11 12    Perceived Dyspnea  0 0    VO2 Peak 6.89 9.37    Symptoms No No    Resting HR 66 bpm 91 bpm    Resting BP 166/74 128/64    Resting Oxygen Saturation  95 % 96 %    Exercise Oxygen Saturation  during 6 min walk 88 % 87 %    Max Ex. HR 105 bpm 130 bpm    Max Ex. BP 152/72 154/64    2 Minute Post BP 132/76 --      Interval HR   1 Minute HR 79 111    2 Minute HR 94 117    3 Minute HR 105 130    4 Minute HR 105 124    5 Minute HR 100 127    6 Minute HR 105 123    2 Minute Post HR 70 94    Interval Heart Rate? Yes Yes      Interval Oxygen   Interval Oxygen? Yes Yes    Baseline Oxygen Saturation  % 95 % 96 %    1 Minute Oxygen Saturation % 92 % 96 %    1 Minute Liters of Oxygen 0 L  RA 0 L  RA    2 Minute Oxygen Saturation % 90 % 89 %    2 Minute Liters of Oxygen 0 L 0 L    3 Minute Oxygen Saturation % 88 % 88 %    3 Minute Liters of Oxygen 0 L 0 L    4 Minute Oxygen Saturation % 89 % 87 %    4 Minute Liters of Oxygen 0 L 0 L    5 Minute Oxygen Saturation % 88 % 88 %    5 Minute Liters of Oxygen 0 L 0 L    6 Minute Oxygen Saturation % 87 % 88 %    6 Minute Liters of Oxygen 0 L 0 L    2 Minute Post Oxygen Saturation % 96 % 98 %    2 Minute Post Liters of Oxygen 0 L 0 L               Nutrition & Weight - Outcomes:  Pre Biometrics - 12/16/21 1708       Pre Biometrics   Height _0  (1.626 m)    Weight 185 lb 3.2 oz (84 kg)    BMI (Calculated) 31.77    Single Leg Stand 24.3 seconds             Post Biometrics - 04/26/22 1501        Post  Biometrics   Height _1  (1.626 m)    Weight 187 lb 11.2  oz (85.1 kg)    BMI (Calculated) 32.2    Single Leg Stand 24.5 seconds              Goals reviewed with patient; copy given to patient.

## 2022-05-06 ENCOUNTER — Encounter: Payer: Medicare HMO | Admitting: *Deleted

## 2022-05-06 DIAGNOSIS — J8410 Pulmonary fibrosis, unspecified: Secondary | ICD-10-CM

## 2022-05-06 NOTE — Progress Notes (Signed)
Daily Session Note  Patient Details  Name: Sean Waters MRN: [id number] Date of Birth: [birth year] Referring Provider:   Flowsheet Row Pulmonary Rehab from 12/16/2021 in [facility] Cardiac and Pulmonary Rehab  Referring Provider Wallene Huh MD       Encounter Date: 05/06/2022  Check In:  Session Check In - 05/06/22 1418       Check-In   Supervising physician immediately available to respond to emergencies See telemetry face sheet for immediately available ER MD    Location [facility]-Cardiac & Pulmonary Rehab    Staff Present [name], RN, BSN, CCRP;Meredith Sherryll Burger, RN BSN;[place], [place]    Virtual Visit No    Medication changes reported     No    Fall or balance concerns reported    No    Warm-up and Cool-down Performed on first and last piece of equipment    Resistance Training Performed Yes    VAD Patient? No    PAD/SET Patient? No      Pain Assessment   Currently in Pain? No/denies                Social History   Tobacco Use  Smoking Status Former   Packs/day: 1.00   Types: Cigarettes   Quit date: 03/17/1953   Years since quitting: 69.1  Smokeless Tobacco Never    Goals Met:  Proper associated with RPD/PD & O2 Sat Independence with exercise equipment Exercise tolerated well No report of concerns or symptoms today  Goals Unmet:  Not Applicable  Comments: Pt able to follow exercise prescription today without complaint.  Will continue to monitor for progression.    Dr. Emily Filbert is Medical Director for [place].  Dr. Ottie Glazier is Medical Director for [facility] Pulmonary Rehabilitation.

## 2022-05-12 ENCOUNTER — Encounter: Payer: Self-pay | Admitting: *Deleted

## 2022-05-12 DIAGNOSIS — J8410 Pulmonary fibrosis, unspecified: Secondary | ICD-10-CM

## 2022-05-12 NOTE — Progress Notes (Signed)
Pulmonary Individual Treatment Plan  Patient Details  Name: Phinehas Grounds MRN: [id number] Date of Birth: 01-30-1941 Referring Provider:   Flowsheet Row Pulmonary Rehab from 12/16/2021 in [facility] Cardiac and Pulmonary Rehab  Referring Provider Wallene Huh MD       Initial Encounter Date:  Flowsheet Row Pulmonary Rehab from 12/16/2021 in [facility] Cardiac and Pulmonary Rehab  Date 12/16/21       Visit Diagnosis: Pulmonary fibrosis ([place])  Patient's Home Medications on Admission:  Current Outpatient Medications:    albuterol (VENTOLIN HFA) 108 (90 Base) MCG/ACT inhaler, Inhale 2 puffs into the lungs every 6 (six) hours as needed., Disp: , Rfl:    ALPRAZolam (XANAX) 0.25 MG tablet, Take 0.25 mg by mouth as needed for anxiety., Disp: , Rfl:    enalapril (VASOTEC) 10 MG tablet, Take 1.5 tablets by mouth daily., Disp: , Rfl:    ezetimibe-simvastatin (VYTORIN) 10-10 MG tablet, Take 1 tablet by mouth at bedtime., Disp: , Rfl:    levothyroxine (SYNTHROID) 50 MCG tablet, Take 50 mcg by mouth daily., Disp: , Rfl:    predniSONE (DELTASONE) 10 MG tablet, Take 1 tablet by mouth daily., Disp: , Rfl:    predniSONE (DELTASONE) 5 MG tablet, Take by mouth., Disp: , Rfl:    tadalafil (CIALIS) 5 MG tablet, Take by mouth., Disp: , Rfl:   Past Medical History: No past medical history on file.  Tobacco Use: Social History   Tobacco Use  Smoking Status Former   Packs/day: 1.00   Types: Cigarettes   Quit date: 03/17/1953   Years since quitting: 69.2  Smokeless Tobacco Never    Labs: Review Flowsheet        No data to display           Pulmonary Assessment Scores:  Pulmonary Assessment Scores     Row Name 12/16/21 1707         ADL UCSD   ADL Phase Entry     SOB Score total 17     Rest 0     Walk 1     Stairs 1     Bath 0     Dress 0     Shop 1       CAT Score   CAT Score 4       mMRC Score   mMRC Score 1              UCSD: Self-administered rating of dyspnea  associated with activities of daily living (ADLs) 6-point scale (0 = "not at all" to 5 = "maximal or unable to do because of breathlessness")  Scoring Scores range from 0 to 120.  Minimally important difference is 5 units  CAT: CAT can identify the health impairment of COPD patients and is better correlated with disease progression.  CAT has a scoring range of zero to 40. The CAT score is classified into four groups of low (less than 10), medium (10 - 20), high (21-30) and very high (31-40) based on the impact level of disease on health status. A CAT score over 10 suggests significant symptoms.  A worsening CAT score could be explained by an exacerbation, poor medication adherence, poor inhaler technique, or progression of COPD or comorbid conditions.  CAT MCID is 2 points  mMRC: mMRC (Modified Medical Research Council) Dyspnea Scale is used to assess the degree of baseline functional disability in patients of respiratory disease due to dyspnea. No minimal important difference is established. A decrease in score of 1 point  or greater is considered a positive change.   Pulmonary Function Assessment:   Exercise Target Goals: Exercise Program Goal: Individual exercise prescription set using results from initial 6 min walk test and THRR while considering  patient's activity barriers and safety.   Exercise Prescription Goal: Initial exercise prescription builds to 30-45 minutes a day of aerobic activity, 2-3 days per week.  Home exercise guidelines will be given to patient during program as part of exercise prescription that the participant will acknowledge.  Education: Aerobic Exercise: - Group verbal and visual presentation on the components of exercise prescription. Introduces F.I.T.T principle from ACSM for exercise prescriptions.  Reviews F.I.T.T. principles of aerobic exercise including progression. Written material given at graduation. Flowsheet Row Pulmonary Rehab from 03/24/2022 in [facility]  Cardiac and Pulmonary Rehab  Education need identified 12/16/21  Date 03/24/22  Educator [place]  Instruction Review Code 1- Verbalizes Understanding       Education: Resistance Exercise: - Group verbal and visual presentation on the components of exercise prescription. Introduces F.I.T.T principle from ACSM for exercise prescriptions  Reviews F.I.T.T. principles of resistance exercise including progression. Written material given at graduation.    Education: Exercise & Equipment Safety: - Individual verbal instruction and demonstration of equipment use and safety with use of the equipment. Flowsheet Row Pulmonary Rehab from 03/24/2022 in [facility] Cardiac and Pulmonary Rehab  Education need identified 12/16/21  Date 12/16/21  Educator [place]  Instruction Review Code 1- Verbalizes Understanding       Education: Exercise Physiology & General Exercise Guidelines: - Group verbal and written instruction with models to review the exercise physiology of the cardiovascular system and associated critical values. Provides general exercise guidelines with specific guidelines to those with heart or lung disease.  Flowsheet Row Pulmonary Rehab from 03/24/2022 in [facility] Cardiac and Pulmonary Rehab  Date 01/13/22  Educator kl  Instruction Review Code 1- [employer] Understanding       Education: Flexibility, Balance, Mind/Body Relaxation: - Group verbal and visual presentation with interactive activity on the components of exercise prescription. Introduces F.I.T.T principle from ACSM for exercise prescriptions. Reviews F.I.T.T. principles of flexibility and balance exercise training including progression. Also discusses the mind body connection.  Reviews various relaxation techniques to help reduce and manage stress (i.e. Deep breathing, progressive muscle relaxation, and visualization). Balance handout provided to take home. Written material given at graduation.   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 HR 105 bpm 130 bpm    Max Ex. BP 152/72 154/64    2 Minute Post BP 132/76 --      Interval HR   1 Minute HR 79 111    2 Minute HR 94 117    3 Minute HR 105 130    4 Minute HR 105 124    5 Minute HR 100 127    6 Minute HR 105 123    2 Minute Post HR 70 94    Interval Heart Rate? Yes Yes      Interval Oxygen   Interval Oxygen? Yes Yes    Baseline Oxygen Saturation % 95 % 96 %    1 Minute Oxygen Saturation % 92 % 96 %    1 Minute Liters of Oxygen 0 L  RA 0 L  RA    2 Minute Oxygen Saturation % 90 % 89 %    2 Minute Liters of Oxygen 0 L 0 L    3 Minute Oxygen Saturation % 88 % 88 %    3 Minute Liters of Oxygen 0 L 0 L    4 Minute Oxygen Saturation % 89 % 87 %    4 Minute Liters of Oxygen 0 L 0 L    5 Minute Oxygen Saturation % 88 % 88 %    5 Minute Liters of Oxygen 0 L 0 L    6 Minute Oxygen Saturation % 87 % 88 %    6 Minute Liters of Oxygen 0 L 0 L    2 Minute Post Oxygen Saturation % 96 % 98 %    2 Minute Post Liters of Oxygen 0 L 0 L            Oxygen Initial Assessment:  Oxygen Initial Assessment - 04/26/22 1454       Home Oxygen   Home Oxygen Device None    Sleep Oxygen Prescription CPAP     Liters per minute 5    Home Exercise Oxygen Prescription None    Home Resting Oxygen Prescription None    Compliance with Home Oxygen Use Yes      Initial 6 min Walk   Oxygen Used None      Program Oxygen Prescription   Program Oxygen Prescription None      Intervention   Long  Term Goals Compliance with respiratory medication;Exhibits proper breathing techniques, such as pursed lip breathing or other method taught during program session;Maintenance of O2 saturations>88%;Verbalizes importance of monitoring SPO2 with pulse oximeter and return demonstration             Oxygen Re-Evaluation:  Oxygen Re-Evaluation     Row Name 12/30/21 1353 02/15/22 1426 03/08/22 1436 04/05/22 1343 04/26/22 1454     Program Oxygen Prescription   Program Oxygen Prescription None None None None --     Home Oxygen   Home Oxygen Device None None None None --   Sleep Oxygen Prescription CPAP CPAP CPAP CPAP --   Liters per minute _0 --   Home Exercise Oxygen Prescription None None None None --   Home Resting Oxygen Prescription None None None None --   Compliance with Home Oxygen Use Yes Yes Yes Yes --     Goals/Expected Outcomes   Short Term Goals To learn and understand importance of monitoring SPO2 with pulse oximeter and demonstrate accurate  use of the pulse oximeter.;To learn and understand importance of maintaining oxygen saturations>88%;To learn and demonstrate proper pursed lip breathing techniques or other breathing techniques. ;To learn and demonstrate proper use of respiratory medications To learn and understand importance of monitoring SPO2 with pulse oximeter and demonstrate accurate use of the pulse oximeter.;To learn and understand importance of maintaining oxygen saturations>88%;To learn and demonstrate proper pursed lip breathing techniques or other breathing techniques. ;To learn and demonstrate proper use of respiratory medications To learn and understand importance of monitoring  SPO2 with pulse oximeter and demonstrate accurate use of the pulse oximeter.;To learn and understand importance of maintaining oxygen saturations>88%;To learn and demonstrate proper pursed lip breathing techniques or other breathing techniques. ;To learn and demonstrate proper use of respiratory medications To learn and understand importance of monitoring SPO2 with pulse oximeter and demonstrate accurate use of the pulse oximeter.;To learn and understand importance of maintaining oxygen saturations>88%;To learn and demonstrate proper pursed lip breathing techniques or other breathing techniques. ;To learn and demonstrate proper use of respiratory medications To learn and understand importance of monitoring SPO2 with pulse oximeter and demonstrate accurate use of the pulse oximeter.;To learn and understand importance of maintaining oxygen saturations>88%;To learn and demonstrate proper pursed lip breathing techniques or other breathing techniques. ;To learn and demonstrate proper use of respiratory medications   Long  Term Goals Compliance with respiratory medication;Exhibits proper breathing techniques, such as pursed lip breathing or other method taught during program session;Maintenance of O2 saturations>88%;Verbalizes importance of monitoring SPO2 with pulse oximeter and return demonstration Compliance with respiratory medication;Exhibits proper breathing techniques, such as pursed lip breathing or other method taught during program session;Maintenance of O2 saturations>88%;Verbalizes importance of monitoring SPO2 with pulse oximeter and return demonstration Compliance with respiratory medication;Exhibits proper breathing techniques, such as pursed lip breathing or other method taught during program session;Maintenance of O2 saturations>88%;Verbalizes importance of monitoring SPO2 with pulse oximeter and return demonstration Compliance with respiratory medication;Exhibits proper breathing techniques, such as  pursed lip breathing or other method taught during program session;Maintenance of O2 saturations>88%;Verbalizes importance of monitoring SPO2 with pulse oximeter and return demonstration --   Comments -- Joe is good about using his CPAP each night.  He sleeps better with it.  He is doing well on his meds.  His saturations continue to do well.  We will conitnue to check in with him. Joe is good about using his CPAP each night.  He sleeps better with it.  He is doing well on his meds.  His saturations continue to do well.  He meets with a team of pulmonologists at [facility] in December to dig deeper into the status of his lung disease and hopefully get more answers. Wille Glaser is doing well and has not needed to use his inhaler.  He did notice that the cooler weather has made it harder to breathe. He is trying to use his PLB.  He is staying compliant with he CPAP.  He recently changed to a full mask and still getting used to it. Joe continues to do well. He continues to watch his O2 at home, he knows his saturations need to stay above 88%. During his walking, he tends to drop to about 88% but stops and rest and recovers well up to  96-98%. He is always reminded to use PLB and still uses CPAP. He is getting ready to graduate and is going to stay consistent with his exercise and watching his vitals closely. He knows to reach out to his doctors  if he sees or feels any different or notices any big changes.   Goals/Expected Outcomes -- Short: Conitnue to use meds and CPAP daily Long: conitnue to monitor saturations Short: Conitnue to use meds and CPAP daily. Have appointment with [place] pulmonologists.  Long: conitnue to monitor saturations. Short: Adjust to new mask Long: continue to use PLB Short: Graduate Long: Continue to utilize PLB and monitor O2 saturations            Oxygen Discharge (Final Oxygen Re-Evaluation):  Oxygen Re-Evaluation - 04/26/22 1454       Goals/Expected Outcomes   Short Term Goals To learn and  understand importance of monitoring SPO2 with pulse oximeter and demonstrate accurate use of the pulse oximeter.;To learn and understand importance of maintaining oxygen saturations>88%;To learn and demonstrate proper pursed lip breathing techniques or other breathing techniques. ;To learn and demonstrate proper use of respiratory medications    Comments Joe continues to do well. He continues to watch his O2 at home, he knows his saturations need to stay above 88%. During his walking, he tends to drop to about 88% but stops and rest and recovers well up to  96-98%. He is always reminded to use PLB and still uses CPAP. He is getting ready to graduate and is going to stay consistent with his exercise and watching his vitals closely. He knows to reach out to his doctors if he sees or feels any different or notices any big changes.    Goals/Expected Outcomes Short: Graduate Long: Continue to utilize PLB and monitor O2 saturations             Initial Exercise Prescription:  Initial Exercise Prescription - 12/16/21 1700       Date of Initial Exercise RX and Referring Provider   Date 12/16/21    Referring Provider Wallene Huh MD      Oxygen   Maintain Oxygen Saturation 88% or higher      Treadmill   MPH 2    Grade 0.5    Minutes 15    METs 2.53      Recumbant Elliptical   Level 1    Watts 50    Minutes 15    METs 1.9      REL-XR   Level 1    Speed 50    Minutes 15    METs 1.9      Prescription Details   Frequency (times per week) 2    Duration Progress to 30 minutes of continuous aerobic without signs/symptoms of physical distress      Intensity   THRR 40-80% of Max Heartrate 95 - 124    Ratings of Perceived Exertion 11-13    Perceived Dyspnea 0-4      Progression   Progression Continue to progress workloads to maintain intensity without signs/symptoms of physical distress.      Resistance Training   Training Prescription Yes    Weight 4 lb    Reps 10-15              Perform Capillary Blood Glucose checks as needed.  Exercise Prescription Changes:   Exercise Prescription Changes     Row Name 12/16/21 1700 12/28/21 1100 01/04/22 1500 01/11/22 1400 01/27/22 0900     Response to Exercise   Blood Pressure (Admit) 140/74 106/64 -- 124/64 138/66   Blood Pressure (Exercise) 166/74 148/68 -- 118/58 144/62   Blood Pressure (Exit) 132/76 120/70 -- 118/62 110/60   Heart Rate (Admit) 66 bpm 75  bpm -- 92 bpm 100 bpm   Heart Rate (Exercise) 105 bpm 109 bpm -- 97 bpm 106 bpm   Heart Rate (Exit) 70 bpm 89 bpm -- 90 bpm 81 bpm   Oxygen Saturation (Admit) 95 % 94 % -- 94 % 93 %   Oxygen Saturation (Exercise) 88 % 88 % -- 89 % 90 %   Oxygen Saturation (Exit) 96 % 91 % -- 94 % 91 %   Rating of Perceived Exertion (Exercise) 11 11 -- 12 11   Perceived Dyspnea (Exercise) 0 -- -- 0 0   Symptoms none none -- none none   Comments 6MWT results 2nd full day of exercise -- -- --   Duration -- Progress to 30 minutes of  aerobic without signs/symptoms of physical distress -- Continue with 30 min of aerobic exercise without signs/symptoms of physical distress. Continue with 30 min of aerobic exercise without signs/symptoms of physical distress.   Intensity -- THRR unchanged -- THRR unchanged THRR unchanged     Progression   Progression -- Continue to progress workloads to maintain intensity without signs/symptoms of physical distress. -- Continue to progress workloads to maintain intensity without signs/symptoms of physical distress. Continue to progress workloads to maintain intensity without signs/symptoms of physical distress.   Average METs -- 2.39 -- 2.46 2.6     Resistance Training   Training Prescription -- Yes -- Yes Yes   Weight -- 4 lb -- 5 lb 5 lb   Reps -- 10-15 -- 10-15 10-15     Interval Training   Interval Training -- No -- No No     Treadmill   MPH -- 2 -- 2 --   Grade -- 0.5 -- 0.5 --   Minutes -- 15 -- 15 --   METs -- 2.67 -- 2.67 --      Recumbant Bike   Level -- -- -- -- 3.5   Minutes -- -- -- -- 15   METs -- -- -- -- 3.3     NuStep   Level -- -- -- 4 6   Minutes -- -- -- 15 15   METs -- -- -- 2.2 2.2     Recumbant Elliptical   Level -- 1.4 -- 1.4 --   Minutes -- 15 -- 15 --     REL-XR   Level -- 3 -- 3 --   Minutes -- 15 -- 15 --   METs -- 2.2 -- 2 --     Biostep-RELP   Level -- -- -- 1 --   Minutes -- -- -- 15 --   METs -- -- -- 3 --     Home Exercise Plan   Plans to continue exercise at -- -- Home (comment)  recumbent bike, stepper, walking Home (comment)  recumbent bike, stepper, walking Home (comment)  recumbent bike, stepper, walking   Frequency -- -- Add 3 additional days to program exercise sessions. Add 3 additional days to program exercise sessions. Add 3 additional days to program exercise sessions.   Initial Home Exercises Provided -- -- 01/04/22 01/04/22 01/04/22     Oxygen   Maintain Oxygen Saturation -- 88% or higher 88% or higher 88% or higher 88% or higher    Row Name 02/23/22 1000 03/08/22 1600 03/22/22 1400 04/05/22 1400 04/20/22 1400     Response to Exercise   Blood Pressure (Admit) 136/64 118/68 126/64 110/60 140/60   Blood Pressure (Exit) 112/56 132/80 102/70 110/64 128/60   Heart Rate (Admit) 87  bpm 80 bpm 89 bpm 80 bpm 88 bpm   Heart Rate (Exercise) 113 bpm 114 bpm 118 bpm 123 bpm 126 bpm   Heart Rate (Exit) 70 bpm 85 bpm 88 bpm 88 bpm 90 bpm   Oxygen Saturation (Admit) 96 % 96 % 94 % 96 % 92 %   Oxygen Saturation (Exercise) 87 % 90 % 88 % 91 % 88 %   Oxygen Saturation (Exit) 95 % 95 % 96 % 94 % 94 %   Rating of Perceived Exertion (Exercise) _0 Perceived Dyspnea (Exercise) 1 1 0 0 0   Symptoms _1    Duration Continue with 30 min of aerobic exercise without signs/symptoms of physical distress. Continue with 30 min of aerobic exercise without signs/symptoms of physical distress. Continue with 30 min of aerobic exercise without signs/symptoms of  physical distress. Continue with 30 min of aerobic exercise without signs/symptoms of physical distress. Continue with 30 min of aerobic exercise without signs/symptoms of physical distress.   Intensity _2      Progression   Progression Continue to progress workloads to maintain intensity without signs/symptoms of physical distress. Continue to progress workloads to maintain intensity without signs/symptoms of physical distress. Continue to progress workloads to maintain intensity without signs/symptoms of physical distress. Continue to progress workloads to maintain intensity without signs/symptoms of physical distress. Continue to progress workloads to maintain intensity without signs/symptoms of physical distress.   Average METs 3.17 3.5 3.4 -- 2.61     Resistance Training   Training Prescription _3    Weight 5 lb 5 lb 5 lb 5 lb 5 lb   Reps 10-15 10-15 10-15 10-15 10-15     Interval Training   Interval Training _4      Treadmill   MPH 2.5 2.5 2.5 2.5 2.5   Grade 0.5 0.5 0.5 1 0.5   Minutes _5 METs 3.09 3.09 3.2 3.26 3.09     Recumbant Bike   Level 4 3.8 8 -- --   Minutes _6 -- --   METs 2.94 3.6 3.2 -- --     NuStep   Level _7 -- --   Minutes _8 -- --   METs 3.1 2.9 3.1 -- --     REL-XR   Level _9 Minutes _10 METs 3.9 4.9 4.7 3.9 2.2     Home Exercise Plan   Plans to continue exercise at Home (comment)  recumbent bike, stepper, walking Home (comment)  recumbent bike, stepper, walking Home (comment)  recumbent bike, stepper, walking Home (comment)  recumbent bike, stepper, walking Home (comment)  recumbent bike, stepper, walking   Frequency Add 3 additional days to program exercise sessions. Add 3 additional days to program exercise sessions. Add 3 additional days to program exercise sessions. Add 3 additional days to program  exercise sessions. Add 3 additional days to program exercise sessions.   Initial Home Exercises Provided 01/04/22 01/04/22 01/04/22 01/04/22 01/04/22     Oxygen   Maintain Oxygen Saturation 88% or higher 88% or higher 88% or higher 88% or higher 88% or higher    Row Name 05/03/22 1100             Response to Exercise   Blood Pressure (Admit)  128/64       Blood Pressure (Exercise) 154/64       Blood Pressure (Exit) 126/62       Heart Rate (Admit) 91 bpm       Heart Rate (Exercise) 123 bpm       Heart Rate (Exit) 101 bpm       Oxygen Saturation (Admit) 97 %       Oxygen Saturation (Exercise) 89 %       Oxygen Saturation (Exit) 95 %       Rating of Perceived Exertion (Exercise) 12       Perceived Dyspnea (Exercise) 0       Symptoms none       Duration Continue with 30 min of aerobic exercise without signs/symptoms of physical distress.       Intensity THRR unchanged         Progression   Progression Continue to progress workloads to maintain intensity without signs/symptoms of physical distress.       Average METs 3.08         Resistance Training   Training Prescription Yes       Weight 5 lb       Reps 10-15         Interval Training   Interval Training No         Treadmill   MPH 2.5       Grade 1       Minutes 15       METs 3.26         REL-XR   Level 7       Minutes 15       METs 4.1         Home Exercise Plan   Plans to continue exercise at Home (comment)  recumbent bike, stepper, walking       Frequency Add 3 additional days to program exercise sessions.       Initial Home Exercises Provided 01/04/22         Oxygen   Maintain Oxygen Saturation 88% or higher                Exercise Comments:   Exercise Goals and Review:   Exercise Goals     Row Name 12/16/21 1720             Exercise Goals   Increase Physical Activity Yes       Intervention Provide advice, education, support and counseling about physical activity/exercise needs.;Develop  an individualized exercise prescription for aerobic and resistive training based on initial evaluation findings, risk stratification, comorbidities and participant's personal goals.       Expected Outcomes Short Term: Attend rehab on a regular basis to increase amount of physical activity.;Long Term: Add in home exercise to make exercise part of routine and to increase amount of physical activity.;Long Term: Exercising regularly at least 3-5 days a week.       Increase Strength and Stamina Yes       Intervention Provide advice, education, support and counseling about physical activity/exercise needs.;Develop an individualized exercise prescription for aerobic and resistive training based on initial evaluation findings, risk stratification, comorbidities and participant's personal goals.       Expected Outcomes Short Term: Increase workloads from initial exercise prescription for resistance, speed, and METs.;Short Term: Perform resistance training exercises routinely during rehab and add in resistance training at home;Long Term: Improve cardiorespiratory fitness, muscular endurance and strength as measured by increased METs  and functional capacity (6MWT)       Able to understand and use rate of perceived exertion (RPE) scale Yes       Intervention Provide education and explanation on how to use RPE scale       Expected Outcomes Short Term: Able to use RPE daily in rehab to express subjective intensity level;Long Term:  Able to use RPE to guide intensity level when exercising independently       Able to understand and use Dyspnea scale Yes       Intervention Provide education and explanation on how to use Dyspnea scale       Expected Outcomes Short Term: Able to use Dyspnea scale daily in rehab to express subjective sense of shortness of breath during exertion;Long Term: Able to use Dyspnea scale to guide intensity level when exercising independently       Knowledge and understanding of Target Heart Rate  Range (THRR) Yes       Intervention Provide education and explanation of THRR including how the numbers were predicted and where they are located for reference       Expected Outcomes Short Term: Able to state/look up THRR;Long Term: Able to use THRR to govern intensity when exercising independently;Short Term: Able to use daily as guideline for intensity in rehab       Able to check pulse independently Yes       Intervention Review the importance of being able to check your own pulse for safety during independent exercise;Provide education and demonstration on how to check pulse in carotid and radial arteries.       Expected Outcomes Short Term: Able to explain why pulse checking is important during independent exercise;Long Term: Able to check pulse independently and accurately       Understanding of Exercise Prescription Yes       Intervention Provide education, explanation, and written materials on patient's individual exercise prescription       Expected Outcomes Short Term: Able to explain program exercise prescription;Long Term: Able to explain home exercise prescription to exercise independently                Exercise Goals Re-Evaluation :  Exercise Goals Re-Evaluation     [place] Name 12/28/21 1159 12/30/21 1344 01/04/22 1509 01/11/22 1447 01/27/22 0950     Exercise Goal Re-Evaluation   Exercise Goals Review Increase Physical Activity;Increase Strength and Stamina;Understanding of Exercise Prescription Increase Physical Activity;Increase Strength and Stamina;Understanding of Exercise Prescription Increase Physical Activity;Increase Strength and Stamina;Understanding of Exercise Prescription Increase Physical Activity;Increase Strength and Stamina;Understanding of Exercise Prescription Increase Physical Activity;Increase Strength and Stamina;Understanding of Exercise Prescription   Comments Wille Glaser is doing well for the first couple of sessions that he has been here. His oxygen did drop to  88% but he knows to be aware and to stop and PLB when he does. He worked at level 3 on the XR and RPE was appropriate. We will continue to monitor as he progresses in the program. For exercise he will use the stepper and recumbent bike at home: he alternates the time from 1.5 hours and 30 minutes alternating days. He uses a pulse ox to monitor his HR and O2; he keeps his HR around 100-110 during exercise, his O2 stays around 92%. Reviewed that his target HR is 95-124. Reviewed home exercise with pt today.  Pt is already exercising on his stepper and recumbent bike for exercise. He exercises about 1 hour and alternates with 30 minutes  every other day. We did talk about incorporating structured walking to switch up his routine. He is not interested in taking rest days after discussing the importance. He does home exercise on top of rehab in the same day and made sure he is aware that he is not overworking his muscles and that he is also intaking the appropriate and right amount of foods. He is deffering nutrition at this time. He works up to "amount of calories" and recommended to switch more to base off of RPE and HR. He states he usually reached 100-105 bpm HR during his exercise at home.   He is watching his O2 sats at home as he drops sometime during rehab. Reviewed THR, pulse, RPE, sign and symptoms, pulse oximetery and when to call 911 or MD. Also discussed weather considerations and indoor options.  Pt voiced understanding. Wille Glaser is doing well in rehab.  He is up to 5 lb hand weights and 3 METs on the BioStep.  We will continue to monitor his progress. Wille Glaser is doing well in rehab. He improved to level 6 on the T4. He also began using the recumbent bike and did well with level 3.5. He also increased his overall average MET levelt o 2.6 METs. We will continue to monitor his progress in the program.   Expected Outcomes Short: Continue current exercise prescription and watch O2 closely Long: Increase overall MET  level Short: Continue current exercise prescription and watch O2 closely Long: Increase overall MET level Short: Watch O2 closely at home, incorporate some walking to switch up routine Long: Exercise independently at home at appropriate prescription Short: Begin to increase workloads Long: Continue to improve stamina Short: Continue to increase workloads Long: Continue to improve strength and stamina    Row Name 02/09/22 1547 02/15/22 1415 02/23/22 1037 03/08/22 1424 03/08/22 1610     Exercise Goal Re-Evaluation   Exercise Goals Review Increase Physical Activity;Increase Strength and Stamina;Understanding of Exercise Prescription Increase Physical Activity;Increase Strength and Stamina;Understanding of Exercise Prescription Increase Physical Activity;Increase Strength and Stamina;Understanding of Exercise Prescription Increase Physical Activity;Increase Strength and Stamina;Understanding of Exercise Prescription Increase Physical Activity;Increase Strength and Stamina;Understanding of Exercise Prescription   Comments Wille Glaser has not been here since last reivew as he has been out of town. We hope to see his attendance improve as returns back on 9/27. Joe returned today.  He is feeling good with his exercise. He is walking and building a wall in his bathroom on his off days to keep up with his exercise.  He does feel like his strength and stamina are starting to recover. Wille Glaser is doing well with exercise. He recently increased his overall average MET level to 3.17 METs. He also improved to level 4 on the recumbent bike and level 9 on the XR. He has tolerated the treadmill at a speed of 2.5 mph and an incline of 0.5% as well. We will continue to monitor his progress in the program. Joe reports that he feels like he is doing well and feeling like he is breathing better and is stronger. He reports that he exercises at home almost every day using his stepper and recumbant bike and following the home exercise guidelines  provided to him by the pumonary rehab program. He also attends rehab classes consistently and continues to make progress with his workloads. He feels that his pulse ox at home does not read accurately when he is exercising, so it was recommended to him to bring it into class and  make sure it is consistently reading with the pulse ox he wears in class. Wille Glaser is doing well in rehab. He did go back to level 6 on the T4 Nustep. He also worked up to almost 5 METS on the XR! Oxygen saturations are staying above 88%. He would benefit from increasing more incline on the treadmill. We will continue to monitor.   Expected Outcomes Short: Maintain routine attendance Long: Finish [facility] Program Short: Continue to exericse on off days Long: conitnue to improve stamina Short: Continue to increase workload on the treadmill. Long: conitnue to improve stamina Short: check accuracy of pulse ox he uses at home. Long: continue to improve stamina and become independent with exercise routine, while monitoring oxygen satruations. Short: Increase incline on treadmill Long: Continue to increase overall MET level    Row Name 03/22/22 1447 04/05/22 1341 04/05/22 1436 04/20/22 1455 04/26/22 1423     Exercise Goal Re-Evaluation   Exercise Goals Review Increase Physical Activity;Increase Strength and Stamina;Understanding of Exercise Prescription Increase Physical Activity;Increase Strength and Stamina;Understanding of Exercise Prescription Increase Physical Activity;Increase Strength and Stamina;Understanding of Exercise Prescription Increase Physical Activity;Increase Strength and Stamina;Understanding of Exercise Prescription Increase Physical Activity;Increase Strength and Stamina;Understanding of Exercise Prescription   Comments Wille Glaser is doing well in rehab. He has consistently kept his MET level above 3 METs. He has also improved to level 8 on the recumbent bike and continued with level 8 on the XR. We will continue to monitor his  progress in the program. Wille Glaser is doing well in rehab.  He is feeling good with his exercise.  On his off days he is exercise for an hour and an additional half hour on the days he comes to rehab.  He does feel like his strength and stamina are getting better.  He feels like he is able to do more before getting too SOB. Joe continues to do well in rehab. He increased to  a 1% incline on the treadmill and was able to work over 3.2 METS.  He has been consistently working at level 8 on the XR which he tolerates well.  He is reaching his THR each session. Will continue to monitor. Wille Glaser is doing well in rehab. He due for his post 6MWT and will look to improve on that. He also has consistently worked at level 8 on the XR. He has continued to walk on the treadmill at a speed of 2.5 mph but lowered his incline from 1% to 0.5%. We will continue to monitor his progress in the program. Joe completed his post 6MWT and improved by 28.4%. His max HR increased and his lowest O2 report was only 1% lower than his pre 6MWT. Overall, Joe feels good; he has always exercised consistently before he came and is happy that he has been able to keep up with it. He is very diligent about his home exercise as well, where he will be exercising on his stepper and recumbent bike. Encouraged to keep up with the walking as well. He keeps his HR between 100-110 bpm which is hitting his THR. He would benefit from adding in more weight training with hand weights or bands for home use! He will be graduating in January when he comes back from vacation.   Expected Outcomes Short: Continue to increase workloads as tolerated. Long: Continue to increase strength and stamina. Short: continue to exericse on off days Long: Continue to exercise indpendently Short: Continue to slowly increase treadmill workload  Long: Continue to increase  overall MET level Short: Continue to increase treadmill workload. Long: Continue to increase strength and stamina. Short:  Graduate Long: Continue to exercise independently at home    [place] Name 05/03/22 1147             Exercise Goal Re-Evaluation   Exercise Goals Review Increase Physical Activity;Increase Strength and Stamina;Understanding of Exercise Prescription       Comments Joe completed hist post 6MWT and is soon to graduate in January once he returns from vacation. We will encourage him to to increase his XR level back up as he went down last time. We also will encouraged an increase on his treadmill incline. O2 saturations are still staying in range and he consistently hits his THR. Will continur to monitor up until graduation.       Expected Outcomes Short: Increase XR level back to 8 Long: Continue to increase overall MET level                Discharge Exercise Prescription (Final Exercise Prescription Changes):  Exercise Prescription Changes - 05/03/22 1100       Response to Exercise   Blood Pressure (Admit) 128/64    Blood Pressure (Exercise) 154/64    Blood Pressure (Exit) 126/62    Heart Rate (Admit) 91 bpm    Heart Rate (Exercise) 123 bpm    Heart Rate (Exit) 101 bpm    Oxygen Saturation (Admit) 97 %    Oxygen Saturation (Exercise) 89 %    Oxygen Saturation (Exit) 95 %    Rating of Perceived Exertion (Exercise) 12    Perceived Dyspnea (Exercise) 0    Symptoms none    Duration Continue with 30 min of aerobic exercise without signs/symptoms of physical distress.    Intensity THRR unchanged      Progression   Progression Continue to progress workloads to maintain intensity without signs/symptoms of physical distress.    Average METs 3.08      Resistance Training   Training Prescription Yes    Weight 5 lb    Reps 10-15      Interval Training   Interval Training No      Treadmill   MPH 2.5    Grade 1    Minutes 15    METs 3.26      REL-XR   Level 7    Minutes 15    METs 4.1      Home Exercise Plan   Plans to continue exercise at Home (comment)   recumbent bike,  stepper, walking   Frequency Add 3 additional days to program exercise sessions.    Initial Home Exercises Provided 01/04/22      Oxygen   Maintain Oxygen Saturation 88% or higher             Nutrition:  Target Goals: Understanding of nutrition guidelines, daily intake of sodium <1528m, cholesterol <2062m calories 30% from fat and 7% or less from saturated fats, daily to have 5 or more servings of fruits and vegetables.  Education: All About Nutrition: -Group instruction provided by verbal, written material, interactive activities, discussions, models, and posters to present general guidelines for heart healthy nutrition including fat, fiber, MyPlate, the role of sodium in heart healthy nutrition, utilization of the nutrition label, and utilization of this knowledge for meal planning. Follow up email sent as well. Written material given at graduation.   Biometrics:  Pre Biometrics - 12/16/21 1708       Pre Biometrics   Height  _0  (1.626 m)    Weight 185 lb 3.2 oz (84 kg)    BMI (Calculated) 31.77    Single Leg Stand 24.3 seconds             Post Biometrics - 04/26/22 1501        Post  Biometrics   Height _1  (1.626 m)    Weight 187 lb 11.2 oz (85.1 kg)    BMI (Calculated) 32.2    Single Leg Stand 24.5 seconds             Nutrition Therapy Plan and Nutrition Goals:  Nutrition Therapy & Goals - 04/26/22 1446       Nutrition Therapy   RD appointment deferred Yes             Nutrition Assessments:  MEDIFICTS Score Key: ?70 Need to make dietary changes  40-70 Heart Healthy Diet ? 40 Therapeutic Level Cholesterol Diet  Flowsheet Row Pulmonary Rehab from 12/16/2021 in [facility]  Picture Your Plate Total Score on Admission 68      Picture Your Plate Scores: <60 Unhealthy dietary pattern with much room for improvement. 41-50 Dietary pattern unlikely to meet recommendations for good health and room for improvement. 51-60  More healthful dietary pattern, with some room for improvement.  >60 Healthy dietary pattern, although there may be some specific behaviors that could be improved.   Nutrition Goals Re-Evaluation:  Nutrition Goals Re-Evaluation     [place] Name 02/15/22 1419 03/08/22 1430 04/05/22 1347 04/26/22 1446       Goals   Nutrition Goal Continues to defer appointments Continues to defer appointments Continues to defer appointments Continues to defer appointments    Comment Joe is feeling good about his diet. Joe is feeling good about his diet. Joe is feeling good about his diet.  He continues to watch his sugar and intake and limits his snacking.  His guilty pleasure is peanuts!!  He tries to be good. --    Expected Outcome Continue to focus on healthy eating. Continue to focus on healthy eating. Continue to focus on healthy eating --             Nutrition Goals Discharge (Final Nutrition Goals Re-Evaluation):  Nutrition Goals Re-Evaluation - 04/26/22 1446       Goals   Nutrition Goal Continues to defer appointments             Psychosocial: Target Goals: Acknowledge presence or absence of significant depression and/or stress, maximize coping skills, provide positive support system. Participant is able to verbalize types and ability to use techniques and skills needed for reducing stress and depression.   Education: Stress, Anxiety, and Depression - Group verbal and visual presentation to define topics covered.  Reviews how body is impacted by stress, anxiety, and depression.  Also discusses healthy ways to reduce stress and to treat/manage anxiety and depression.  Written material given at graduation. Flowsheet Row Pulmonary Rehab from 03/24/2022 in [facility]  Date 03/10/22  Educator [place]  Instruction Review Code 1- Verbalizes Understanding       Education: Sleep Hygiene -Provides group verbal and written instruction about how sleep can affect your health.   Define sleep hygiene, discuss sleep cycles and impact of sleep habits. Review good sleep hygiene tips.    Initial Review & Psychosocial Screening:  Initial Psych Review & Screening - 12/03/21 1329       Initial Review   Current issues  with Current Stress Concerns    Source of Stress Concerns Chronic Illness    Comments He reports feeling stress with his diagnosis. He has called a patient advocate and told them what Dr. Raul Del said about his prognosis and that he didn't feel like he was supported; he didn't have someone on staff to talk to after being given his prognosis. When he got home he was depressed, lost 15 lbs, and thought he was going to die. He was looking for guidance and didn't feel like he got it - he spoke to patient relations and did not get a response. He reports that he was diagnosed in March and he was the one to ask for a referall to rehab.      Family Dynamics   Good Support System? Yes   He reports that his wife is also his patient advocate and he is a Occupational psychologist of the [employer].     Barriers   Psychosocial barriers to participate in program The patient should benefit from training in stress management and relaxation.      Screening Interventions   Interventions Encouraged to exercise;Provide feedback about the scores to participant;To provide support and resources with identified psychosocial needs    Expected Outcomes Short Term goal: Utilizing psychosocial counselor, staff and physician to assist with identification of specific Stressors or current issues interfering with healing process. Setting desired goal for each stressor or current issue identified.;Long Term Goal: Stressors or current issues are controlled or eliminated.;Short Term goal: Identification and review with participant of any Quality of Life or Depression concerns found by scoring the questionnaire.;Long Term goal: The participant improves quality of Life and PHQ9 Scores as seen by post scores  and/or verbalization of changes             Quality of Life Scores:  Scores of 19 and below usually indicate a poorer quality of life in these areas.  A difference of  2-3 points is a clinically meaningful difference.  A difference of 2-3 points in the total score of the Quality of Life Index has been associated with significant improvement in overall quality of life, self-image, physical symptoms, and general health in studies assessing change in quality of life.  PHQ-9: Review Flowsheet       12/16/2021  Depression screen PHQ 2/9  Decreased Interest 0  Down, Depressed, Hopeless 0  PHQ - 2 Score 0  Altered sleeping 0  Tired, decreased energy 1  Change in appetite 0  Feeling bad or failure about yourself  0  Trouble concentrating 0  Moving slowly or fidgety/restless 0  Suicidal thoughts 0  PHQ-9 Score 1  Difficult doing work/chores Somewhat difficult   Interpretation of Total Score  Total Score Depression Severity:  1-4 = Minimal depression, 5-9 = Mild depression, 10-14 = Moderate depression, 15-19 = Moderately severe depression, 20-27 = Severe depression   Psychosocial Evaluation and Intervention:  Psychosocial Evaluation - 12/03/21 1433       Psychosocial Evaluation & Interventions   Interventions Stress management education;Relaxation education;Encouraged to exercise with the program and follow exercise prescription    Comments He reports feeling stress with his diagnosis. He has called a patient advocate and told them what Dr. Raul Del said about his prognosis and that he didn't feel like he was supported; he didn't have someone on staff to talk to after being given his prognosis. When he got home he was depressed, lost 15 lbs, and thought he was going to die.  He was looking for guidance and didn't feel like he got it - he spoke to patient relations and did not get a response. He reports that he was diagnosed in March and he was the one to ask for a referall to rehab. He  reports that his wife is also his patient advocate when he goes to the doctor and he is a Occupational psychologist of the [employer] to help with support. He also has two children and 6 grandchildren in [place] who he visits. He has been retired and is still working on relaxing in the afternoon instead of being productive; in the mornings he exercises and plays the piano. For exercise he will use the stepper and recumbent bike at home: he alternates the time from 1.5 hours and 30 minutes alternating days. He uses a pulse ox to monitor his HR and O2; he keeps his HR around 100 during exercise. He is excited to start Pulmonary Rehab.    Expected Outcomes ST: attend all scheduled exercise/education appointments, progress with exercise prescription while maintaining SpO2 <88%  LT: Improve shortness of breath with ADLs    Continue Psychosocial Services  Follow up required by staff             Psychosocial Re-Evaluation:  Psychosocial Re-Evaluation     [place] Name 12/30/21 1343 12/30/21 1403 02/15/22 1416 03/08/22 1430 04/05/22 1349     Psychosocial Re-Evaluation   Current issues with Current Stress Concerns -- Current Stress Concerns Current Stress Concerns None Identified;Current Stress Concerns   Comments Joe reports that his wife is also his patient advocate when he goes to the doctor and he is a Occupational psychologist of the [employer] to help with support. He also has two children and 6 grandchildren in [place] who he visits. He has been retired and is still working on relaxing in the afternoon instead of being productive; in the mornings he exercises and plays the piano. 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 He feels better about his diagnosis since being on medications and starting rehab as he feels it is helping. he is still upset with how he feels like he was treated when given his diagnosis and would like to go back in  person to talk to someone about it. -- Wille Glaser is doing well in rehab.  He was out for a bit, but now doing well.  He feels good overall.  He is sleeping good for most part.  He is good about using his CPAP each night.  He will get up once, with one trip to bathroom.  He is usually able to get back to sleep.  He is still playing the piano as an outlet. Patient reports no new changes in stress, sleep, or mental health concerns. He reports that we continues to wear his CPAP and sleeps well most nights. Wille Glaser is doing well in rehab.  He is consistent with using his CPAP at night and has recently switched to a full mask that he is gettting used to.  He is doing well mentally for most part.  He has down days, but tries to stay positive and do with what he's got.  He is looking forward to meeting with pulmonolgist next month for full work up.  He is learning his limits.  He is pleased with staying on course and feeling good again.  He no longer  feels like he is looking at a death sentence.   Expected Outcomes ST: continue to attend rehab for mental health boost LT: continue to engage in stress reducing activites -- Short: Continue to use piano as a stress release Long: continue to stay positive Short: Continue to use piano as a stress release, and continue to use CPAP to aid in quality sleep.  Long: continue to stay positive Short: Continue to take each day at a time and look forward to pulmonolgy work up.  Long: continue to stay positive   Interventions Encouraged to attend Pulmonary Rehabilitation for the exercise -- Encouraged to attend Pulmonary Rehabilitation for the exercise Encouraged to attend Pulmonary Rehabilitation for the exercise Encouraged to attend Pulmonary Rehabilitation for the exercise   Continue Psychosocial Services  Follow up required by staff -- -- Follow up required by staff Follow up required by staff    [place] Name 04/26/22 1439             Psychosocial Re-Evaluation   Current issues with None  Identified       Comments Joe continues to do well here at rehab. He is getting close to graduating and just finished his post 6MWT. Overall, he feels really good; he is diligent about checking his HR and O2 and home. He is going on a vacation with his wife starting tomorrow which he is very excited about and then also traveling for Christmas the week after. He is getting another walk test done and further testing with his doctor the 1st week of January and is hoping to hear good results. He is pleased his prednisone decreased from 15 down to 98m. He just finished up schooling for his music class as uses that as his stress relief and keep his brain in top top shape. Denies other issues or concerns at this time. He will be ready to graduate when he comes back from vacation!       Expected Outcomes Short: Graduate Long: Continue to maintain positive attitude       Interventions Encouraged to attend Pulmonary Rehabilitation for the exercise       Continue Psychosocial Services  Follow up required by staff                Psychosocial Discharge (Final Psychosocial Re-Evaluation):  Psychosocial Re-Evaluation - 04/26/22 1439       Psychosocial Re-Evaluation   Current issues with None Identified    Comments Joe continues to do well here at rehab. He is getting close to graduating and just finished his post 6MWT. Overall, he feels really good; he is diligent about checking his HR and O2 and home. He is going on a vacation with his wife starting tomorrow which he is very excited about and then also traveling for Christmas the week after. He is getting another walk test done and further testing with his doctor the 1st week of January and is hoping to hear good results. He is pleased his prednisone decreased from 15 down to 159m He just finished up schooling for his music class as uses that as his stress relief and keep his brain in top top shape. Denies other issues or concerns at this time. He will be  ready to graduate when he comes back from vacation!    Expected Outcomes Short: Graduate Long: Continue to maintain positive attitude    Interventions Encouraged to attend Pulmonary Rehabilitation for the exercise    Continue Psychosocial Services  Follow up required by staff  Education: Education Goals: Education classes will be provided on a weekly basis, covering required topics. Participant will state understanding/return demonstration of topics presented.  Learning Barriers/Preferences:   General Pulmonary Education Topics:  Infection Prevention: - Provides verbal and written material to individual with discussion of infection control including proper hand washing and proper equipment cleaning during exercise session. Flowsheet Row Pulmonary Rehab from 03/24/2022 in [facility] Cardiac and Pulmonary Rehab  Education need identified 12/16/21  Date 12/16/21  Educator [place]  Instruction Review Code 1- Verbalizes Understanding       Falls Prevention: - Provides verbal and written material to individual with discussion of falls prevention and safety. Flowsheet Row Pulmonary Rehab from 12/03/2021 in [facility] Cardiac and Pulmonary Rehab  Education need identified 12/03/21  Date 12/03/21  Educator [place]  Instruction Review Code 1- Verbalizes Understanding       Chronic Lung Disease Review: - Group verbal instruction with posters, models, PowerPoint presentations and videos,  to review new updates, new respiratory medications, new advancements in procedures and treatments. Providing information on websites and "800" numbers for continued self-education. Includes information about supplement oxygen, available portable oxygen systems, continuous and intermittent flow rates, oxygen safety, concentrators, and Medicare reimbursement for oxygen. Explanation of Pulmonary Drugs, including class, frequency, complications, importance of spacers, rinsing mouth after steroid MDI's, and proper  cleaning methods for nebulizers. Review of basic lung anatomy and physiology related to function, structure, and complications of lung disease. Review of risk factors. Discussion about methods for diagnosing sleep apnea and types of masks and machines for OSA. Includes a review of the use of types of environmental controls: home humidity, furnaces, filters, dust mite/pet prevention, HEPA vacuums. Discussion about weather changes, air quality and the benefits of nasal washing. Instruction on Warning signs, infection symptoms, calling MD promptly, preventive modes, and value of vaccinations. Review of effective airway clearance, coughing and/or vibration techniques. Emphasizing that all should Create an Action Plan. Written material given at graduation. Flowsheet Row Pulmonary Rehab from 03/24/2022 in [facility] Cardiac and Pulmonary Rehab  Education need identified 12/16/21  Date 12/30/21  Educator [facility]  Instruction Review Code 1- Verbalizes Understanding       AED/CPR: - Group verbal and written instruction with the use of models to demonstrate the basic use of the AED with the basic ABC's of resuscitation.    Anatomy and Cardiac Procedures: - Group verbal and visual presentation and models provide information about basic cardiac anatomy and function. Reviews the testing methods done to diagnose heart disease and the outcomes of the test results. Describes the treatment choices: Medical Management, Angioplasty, or Coronary Bypass Surgery for treating various heart conditions including Myocardial Infarction, Angina, Valve Disease, and Cardiac Arrhythmias.  Written material given at graduation.   Medication Safety: - Group verbal and visual instruction to review commonly prescribed medications for heart and lung disease. Reviews the medication, class of the drug, and side effects. Includes the steps to properly store meds and maintain the prescription regimen.  Written material given at  graduation.   Other: -Provides group and verbal instruction on various topics (see comments)   Knowledge Questionnaire Score:  Knowledge Questionnaire Score - 12/16/21 1706       Knowledge Questionnaire Score   Pre Score 13/18              Core Components/Risk Factors/Patient Goals at Admission:  Personal Goals and Risk Factors at Admission - 12/16/21 1722       Core Components/Risk Factors/Patient Goals on Admission  Weight Management Yes;Weight Loss    Intervention Weight Management: Develop a combined nutrition and exercise program designed to reach desired caloric intake, while maintaining appropriate intake of nutrient and fiber, sodium and fats, and appropriate energy expenditure required for the weight goal.;Weight Management: Provide education and appropriate resources to help participant work on and attain dietary goals.;Weight Management/Obesity: Establish reasonable short term and long term weight goals.    Admit Weight 185 lb (83.9 kg)    Goal Weight: Short Term 180 lb (81.6 kg)    Goal Weight: Long Term 175 lb (79.4 kg)    Expected Outcomes Short Term: Continue to assess and modify interventions until short term weight is achieved;Long Term: Adherence to nutrition and physical activity/exercise program aimed toward attainment of established weight goal;Understanding recommendations for meals to include 15-35% energy as protein, 25-35% energy from fat, 35-60% energy from carbohydrates, less than 272m of dietary cholesterol, 20-35 gm of total fiber daily;Understanding of distribution of calorie intake throughout the day with the consumption of 4-5 meals/snacks;Weight Loss: Understanding of general recommendations for a balanced deficit meal plan, which promotes 1-2 lb weight loss per week and includes a negative energy balance of [phone] kcal/d    Improve shortness of breath with ADL's Yes    Intervention Provide education, individualized exercise plan and daily  activity instruction to help decrease symptoms of SOB with activities of daily living.    Expected Outcomes Short Term: Improve cardiorespiratory fitness to achieve a reduction of symptoms when performing ADLs;Long Term: Be able to perform more ADLs without symptoms or delay the onset of symptoms    Increase knowledge of respiratory medications and ability to use respiratory devices properly  Yes    Intervention Provide education and demonstration as needed of appropriate use of medications, inhalers, and oxygen therapy.    Expected Outcomes Short Term: Achieves understanding of medications use. Understands that oxygen is a medication prescribed by physician. Demonstrates appropriate use of inhaler and oxygen therapy.;Long Term: Maintain appropriate use of medications, inhalers, and oxygen therapy.    Hypertension Yes    Intervention Provide education on lifestyle modifcations including regular physical activity/exercise, weight management, moderate sodium restriction and increased consumption of fresh fruit, vegetables, and low fat dairy, alcohol moderation, and smoking cessation.;Monitor prescription use compliance.    Expected Outcomes Short Term: Continued assessment and intervention until BP is < 140/973mHG in hypertensive participants. < 130/8049mG in hypertensive participants with diabetes, heart failure or chronic kidney disease.;Long Term: Maintenance of blood pressure at goal levels.    Lipids Yes    Intervention Provide education and support for participant on nutrition & aerobic/resistive exercise along with prescribed medications to achieve LDL <70m38mDL >40mg17m Expected Outcomes Short Term: Participant states understanding of desired cholesterol values and is compliant with medications prescribed. Participant is following exercise prescription and nutrition guidelines.;Long Term: Cholesterol controlled with medications as prescribed, with individualized exercise RX and with personalized  nutrition plan. Value goals: LDL < 70mg,64m > 40 mg.             Education:Diabetes - Individual verbal and written instruction to review signs/symptoms of diabetes, desired ranges of glucose level fasting, after meals and with exercise. Acknowledge that pre and post exercise glucose checks will be done for 3 sessions at entry of program.   Know Your Numbers and Heart Failure: - Group verbal and visual instruction to discuss disease risk factors for cardiac and pulmonary disease and treatment options.  Reviews associated critical  values for Overweight/Obesity, Hypertension, Cholesterol, and Diabetes.  Discusses basics of heart failure: signs/symptoms and treatments.  Introduces Heart Failure Zone chart for action plan for heart failure.  Written material given at graduation. Flowsheet Row Pulmonary Rehab from 03/24/2022 in [facility] Cardiac and Pulmonary Rehab  Date 12/23/21  Educator [place]  Instruction Review Code 1- Verbalizes Understanding       Core Components/Risk Factors/Patient Goals Review:   Goals and Risk Factor Review     Row Name 12/30/21 1346 02/15/22 1419 03/08/22 1432 04/05/22 1345 04/26/22 1446     Core Components/Risk Factors/Patient Goals Review   Personal Goals Review Weight Management/Obesity;Hypertension;Improve shortness of breath with ADL's;Lipids Weight Management/Obesity;Hypertension;Improve shortness of breath with ADL's;Lipids Weight Management/Obesity;Hypertension;Improve shortness of breath with ADL's;Lipids Weight Management/Obesity;Hypertension;Improve shortness of breath with ADL's;Lipids;Increase knowledge of respiratory medications and ability to use respiratory devices properly. Weight Management/Obesity;Hypertension;Improve shortness of breath with ADL's;Increase knowledge of respiratory medications and ability to use respiratory devices properly.   Review Joe feels he is doing well in rehab so far. He continues to take his medications as directed without  issues. He doesn't check his BP at home often, but he has a BP cuff; encouraged him to check BP at home at least 1-2x/week. Joe reports his shortness of breath has been doing well, but the heat can make it difficult. Wille Glaser is doing well.  He is doing well with his breathing and good about using his inhalers.  We reviewed what the purpose for each one was.  His pressures are doing well and he continues to monitor it at home.  He did have a high reading at the doctor's office, but it's been good at other place. He got good results with his 6MWT in the office. He decreased his prednisone to 10 mg.  Wille Glaser is doing well with his weight as well. Joe's weight has been maintained. He feels that he is improving with his SOB and while he is weighting to meet with a team of pulmonologists at [facility] in [place] he is at least feeling better then he was. He is hoping to get more answers once he meets with these new doctors regarding the status of his lung disease. He continues to take all meds a prescribed. Wille Glaser is doing well in rehab. He is getting better with he breathing. He has not needed his inhaler but is compliant with his CPAP.  His pressures are doing well here and at home.  His weight is staying steady.  He would like to lose and continues to work on it. Joe feels good overall since starting the program. He is diligent about watching his oxygen saturations and HR when at home. He doesn't get too SOB when doing high strenuous acitivies. His breathing is labored when he exercises but knows that just comes with his increase in workload. He is staying compliant with his medications. His weight at rehab has been stable ranging on average 187- 190 lb and knows to notify doctor if anything becomes abnormal. He doesn't normally check his BP at home as often as he should, but pressures at rehab have been good. Encouraged for him to start checking them again, especially now that is getting ready to graduate. He is overall pleased  knowing his medications are working and was even able to decrease his prednisone dosage. He had questions about how quick his lung disease can progress, and advised him to ask his doctor as the staff here is not able to determine that. Overall, his numbers  have been looking good on our end, patient has been feeling well- but also explained to patient that we can't determine a prognosis or how quick the disease is advancing. Patient is due to see the [place] [facility] team the 1st week of January. He will be graduating soon after that.   Expected Outcomes ST: check BP at home 1-2x/week  LT: monitor risk factors Short: conitnue to montior bp Long: Continue to montior risk facros. Short: meet with team of pumonologists at [facility] to get more answers about status of lung disease. Long: continue to monitor risk factors. Short: Conitnue to work on weight loss Long: conitnue to monitor risk factors. Short: Graduate LonG: Continue to monitor lifestyle risk factors            Core Components/Risk Factors/Patient Goals at Discharge (Final Review):   Goals and Risk Factor Review - 04/26/22 1446       Core Components/Risk Factors/Patient Goals Review   Personal Goals Review Weight Management/Obesity;Hypertension;Improve shortness of breath with ADL's;Increase knowledge of respiratory medications and ability to use respiratory devices properly.    Review Joe feels good overall since starting the program. He is diligent about watching his oxygen saturations and HR when at home. He doesn't get too SOB when doing high strenuous acitivies. His breathing is labored when he exercises but knows that just comes with his increase in workload. He is staying compliant with his medications. His weight at rehab has been stable ranging on average 187- 190 lb and knows to notify doctor if anything becomes abnormal. He doesn't normally check his BP at home as often as he should, but pressures at rehab have been good. Encouraged for  him to start checking them again, especially now that is getting ready to graduate. He is overall pleased knowing his medications are working and was even able to decrease his prednisone dosage. He had questions about how quick his lung disease can progress, and advised him to ask his doctor as the staff here is not able to determine that. Overall, his numbers have been looking good on our end, patient has been feeling well- but also explained to patient that we can't determine a prognosis or how quick the disease is advancing. Patient is due to see the [place] [facility] team the 1st week of January. He will be graduating soon after that.    Expected Outcomes Short: Graduate LonG: Continue to monitor lifestyle risk factors             ITP Comments:  ITP Comments     Row Name 12/03/21 1430 12/16/21 1705 12/23/21 0955 01/20/22 1344 02/17/22 0754   ITP Comments Virtual orientation call completed today. he has an appointment on Date: 12/16/21  for EP eval and gym Orientation.  Documentation of diagnosis can be found in [facility] Date: 10/28/21 . Completed 6MWT and gym orientation. Initial ITP created and sent for review to Dr. Ottie Glazier, Medical Director. 30 Day review completed. Medical Director ITP review done, changes made as directed, and signed approval by Medical Director.    NEW 30 Day review completed. Medical Director ITP review done, changes made as directed, and signed approval by Medical Director. 30 Day review completed. Medical Director ITP review done, changes made as directed, and signed approval by Medical Director.    [place] Name 03/17/22 1026 04/14/22 0944 05/12/22 1116       ITP Comments 30 Day review completed. Medical Director ITP review done, changes made as directed, and signed approval by  Medical Director. 30 Day review completed. Medical Director ITP review done, changes made as directed, and signed approval by Medical Director. 30 Day review completed. Medical Director ITP review  done, changes made as directed, and signed approval by Medical Director.              Comments:

## 2022-05-19 ENCOUNTER — Encounter: Payer: Medicare HMO | Attending: Specialist | Admitting: *Deleted

## 2022-05-19 DIAGNOSIS — J8410 Pulmonary fibrosis, unspecified: Secondary | ICD-10-CM | POA: Diagnosis not present

## 2022-05-19 NOTE — Progress Notes (Signed)
Daily Session Note  Patient Details  Name: Sean Waters MRN: [id number] Date of Birth: [birth year] Referring Provider:   Flowsheet Row Pulmonary Rehab from 12/16/2021 in [facility] Cardiac and Pulmonary Rehab  Referring Provider Wallene Huh MD       Encounter Date: 05/19/2022  Check In:  Session Check In - 05/19/22 1334       Check-In   Supervising physician immediately available to respond to emergencies See telemetry face sheet for immediately available ER MD    Location [facility]-Cardiac & Pulmonary Rehab    Staff Present Renita Papa, RN Odelia Gage, RN, ADN;Jessica Luan Pulling, MA, RCEP, CCRP, CCET    Virtual Visit No    Medication changes reported     No    Fall or balance concerns reported    No    Warm-up and Cool-down Performed on first and last piece of equipment    Resistance Training Performed Yes    VAD Patient? No    PAD/SET Patient? No      Pain Assessment   Currently in Pain? No/denies                Social History   Tobacco Use  Smoking Status Former   Packs/day: 1.00   Types: Cigarettes   Quit date: 03/17/1953   Years since quitting: 69.2  Smokeless Tobacco Never    Goals Met:  Independence with exercise equipment Exercise tolerated well No report of concerns or symptoms today Strength training completed today  Goals Unmet:  Not Applicable  Comments: Pt able to follow exercise prescription today without complaint.  Will continue to monitor for progression.    Dr. Emily Filbert is Medical Director for [place].  Dr. Ottie Glazier is Medical Director for [facility] Pulmonary Rehabilitation.

## 2022-05-25 ENCOUNTER — Encounter: Payer: Medicare HMO | Admitting: *Deleted

## 2022-05-25 DIAGNOSIS — J8410 Pulmonary fibrosis, unspecified: Secondary | ICD-10-CM

## 2022-05-25 NOTE — Progress Notes (Signed)
Daily Session Note  Patient Details  Name: Sean Waters MRN: [id number] Date of Birth: September 01, 1940 Referring Provider:   Flowsheet Row Pulmonary Rehab from 12/16/2021 in [facility] Cardiac and Pulmonary Rehab  Referring Provider Wallene Huh MD       Encounter Date: 05/25/2022  Check In:  Session Check In - 05/25/22 1152       Check-In   Supervising physician immediately available to respond to emergencies See telemetry face sheet for immediately available ER MD    Location [facility]-Cardiac & Pulmonary Rehab    Staff Present [name], RN, BSN, CCRP;Noah Tickle, BS, Exercise Physiologist;Jessica Fairlee, MA, RCEP, CCRP, Bertram Gala, MS, ACSM CEP, Exercise Physiologist    Virtual Visit No    Medication changes reported     No    Fall or balance concerns reported    No    Warm-up and Cool-down Performed on first and last piece of equipment    Resistance Training Performed Yes    VAD Patient? No    PAD/SET Patient? No      Pain Assessment   Currently in Pain? No/denies                Social History   Tobacco Use  Smoking Status Former   Packs/day: 1.00   Types: Cigarettes   Quit date: 03/17/1953   Years since quitting: 69.2  Smokeless Tobacco Never    Goals Met:  Proper associated with RPD/PD & O2 Sat Independence with exercise equipment Exercise tolerated well No report of concerns or symptoms today  Goals Unmet:  Not Applicable  Comments: Pt able to follow exercise prescription today without complaint.  Will continue to monitor for progression.    Dr. Emily Filbert is Medical Director for [place].  Dr. Ottie Glazier is Medical Director for [facility] Pulmonary Rehabilitation.

## 2022-05-26 ENCOUNTER — Encounter: Payer: Medicare HMO | Admitting: *Deleted

## 2022-05-26 DIAGNOSIS — J8410 Pulmonary fibrosis, unspecified: Secondary | ICD-10-CM

## 2022-05-26 NOTE — Progress Notes (Signed)
Daily Session Note  Patient Details  Name: Sean Waters MRN: [id number] Date of Birth: Jan 06, 1941 Referring Provider:   Flowsheet Row Pulmonary Rehab from 12/16/2021 in [facility] Cardiac and Pulmonary Rehab  Referring Provider Wallene Huh MD       Encounter Date: 05/26/2022  Check In:  Session Check In - 05/26/22 1342       Check-In   Supervising physician immediately available to respond to emergencies See telemetry face sheet for immediately available ER MD    Location [facility]-Cardiac & Pulmonary Rehab    Staff Present Renita Papa, RN BSN;[place] Tessie Fass, RCP,RRT,BSRT;Megan Tamala Julian, RN, [place]    Virtual Visit No    Medication changes reported     No    Fall or balance concerns reported    No    Warm-up and Cool-down Performed on first and last piece of equipment    Resistance Training Performed Yes    VAD Patient? No    PAD/SET Patient? No      Pain Assessment   Currently in Pain? No/denies                Social History   Tobacco Use  Smoking Status Former   Packs/day: 1.00   Types: Cigarettes   Quit date: 03/17/1953   Years since quitting: 69.2  Smokeless Tobacco Never    Goals Met:  Independence with exercise equipment Exercise tolerated well No report of concerns or symptoms today Strength training completed today  Goals Unmet:  Not Applicable  Comments: Pt able to follow exercise prescription today without complaint.  Will continue to monitor for progression.    Dr. Emily Filbert is Medical Director for [place].  Dr. Ottie Glazier is Medical Director for [facility] Pulmonary Rehabilitation.

## 2022-05-27 ENCOUNTER — Ambulatory Visit: Payer: Medicare HMO

## 2022-05-31 ENCOUNTER — Ambulatory Visit: Payer: Medicare HMO

## 2022-06-01 ENCOUNTER — Encounter: Payer: Medicare HMO | Admitting: *Deleted

## 2022-06-01 DIAGNOSIS — J8410 Pulmonary fibrosis, unspecified: Secondary | ICD-10-CM | POA: Diagnosis not present

## 2022-06-01 NOTE — Progress Notes (Signed)
Discharge Summary  [name]  DOB [birth year]  Sean Waters graduated today from  rehab with 36 sessions completed.  Details of the patient's exercise prescription and what He needs to do in order to continue the prescription and progress were discussed with patient.  Patient was given a copy of prescription and goals.  Patient verbalized understanding.  [name] plans to continue to exercise by using his home stepper, recumbent bike, and walking.   [place] Name 12/16/21 1714 04/26/22 1419       6 Minute Walk   Phase Initial Discharge    Distance 1090 feet 1400 feet    Distance % Change -- 28.4 %    Distance Feet Change -- 310 ft    Walk Time 6 minutes 6 minutes    # of Rest Breaks 0 0    MPH 2.06 2.65    METS 1.97 2.67    RPE 11 12    Perceived Dyspnea  0 0    VO2 Peak 6.89 9.37    Symptoms No No    Resting HR 66 bpm 91 bpm    Resting BP 166/74 128/64    Resting Oxygen Saturation  95 % 96 %    Exercise Oxygen Saturation  during 6 min walk 88 % 87 %    Max Ex. HR 105 bpm 130 bpm    Max Ex. BP 152/72 154/64    2 Minute Post BP 132/76 --      Interval HR   1 Minute HR 79 111    2 Minute HR 94 117    3 Minute HR 105 130    4 Minute HR 105 124    5 Minute HR 100 127    6 Minute HR 105 123    2 Minute Post HR 70 94    Interval Heart Rate? Yes Yes      Interval Oxygen   Interval Oxygen? Yes Yes    Baseline Oxygen Saturation % 95 % 96 %    1 Minute Oxygen Saturation % 92 % 96 %    1 Minute Liters of Oxygen 0 L  RA 0 L  RA    2 Minute Oxygen Saturation % 90 % 89 %    2 Minute Liters of Oxygen 0 L 0 L    3 Minute Oxygen Saturation % 88 % 88 %    3 Minute Liters of Oxygen 0 L 0 L    4 Minute Oxygen Saturation % 89 % 87 %    4 Minute Liters of Oxygen 0 L 0 L    5 Minute Oxygen Saturation % 88 % 88 %    5 Minute Liters of Oxygen 0 L 0 L    6 Minute Oxygen Saturation % 87 % 88 %    6 Minute Liters of Oxygen 0 L 0 L    2 Minute Post Oxygen Saturation % 96 % 98 %     2 Minute Post Liters of Oxygen 0 L 0 L

## 2022-06-01 NOTE — Progress Notes (Signed)
Daily Session Note  Patient Details  Name: Sean Waters MRN: [id number] Date of Birth: 07/01/1940 Referring Provider:   Flowsheet Row Pulmonary Rehab from 12/16/2021 in [facility]  Referring Provider Wallene Huh MD       Encounter Date: 06/01/2022  Check In:  Session Check In - 06/01/22 1104       Check-In   Supervising physician immediately available to respond to emergencies See telemetry face sheet for immediately available ER MD    Location [facility] & Pul[facility]    Staff Present Renita Papa, RN BSN;Noah Tickle, BS, Exercise Physiologist;Jessica [place], MA, RCEP, CCRP, CCET    Virtual Visit No    Medication changes reported     No    Fall or balance concerns reported    No    Warm-up and Cool-down Performed on first and last piece of equipment    Resistance Training Performed Yes    VAD Patient? No    PAD/SET Patient? No      Pain Assessment   Currently in Pain? No/denies                Social History   Tobacco Use  Smoking Status Former   Packs/day: 1.00   Types: Cigarettes   Quit date: 03/17/1953   Years since quitting: 69.2  Smokeless Tobacco Never    Goals Met:  Independence with exercise equipment Exercise tolerated well No report of concerns or symptoms today Strength training completed today  Goals Unmet:  Not Applicable  Comments:  Jojo graduated today from  rehab with 36 sessions completed.  Details of the patient's exercise prescription and what He needs to do in order to continue the prescription and progress were discussed with patient.  Patient was given a copy of prescription and goals.  Patient verbalized understanding.  Damany plans to continue to exercise by using his home stepper, recumbent bike, and walking.     Dr. Emily Filbert is Medical Director for [facility].  Dr. Ottie Glazier is Medical Director for [facility].

## 2022-06-01 NOTE — Progress Notes (Signed)
Pulmonary Individual Treatment Plan  Patient Details  Name: Sean Waters MRN: [id number] Date of Birth: 01-30-1941 Referring Provider:   Flowsheet Row Pulmonary Rehab from 12/16/2021 in [facility] Cardiac and Pulmonary Rehab  Referring Provider Wallene Huh MD       Initial Encounter Date:  Flowsheet Row Pulmonary Rehab from 12/16/2021 in [facility] Cardiac and Pulmonary Rehab  Date 12/16/21       Visit Diagnosis: Pulmonary fibrosis ([place])  Patient's Home Medications on Admission:  Current Outpatient Medications:    albuterol (VENTOLIN HFA) 108 (90 Base) MCG/ACT inhaler, Inhale 2 puffs into the lungs every 6 (six) hours as needed., Disp: , Rfl:    ALPRAZolam (XANAX) 0.25 MG tablet, Take 0.25 mg by mouth as needed for anxiety., Disp: , Rfl:    enalapril (VASOTEC) 10 MG tablet, Take 1.5 tablets by mouth daily., Disp: , Rfl:    ezetimibe-simvastatin (VYTORIN) 10-10 MG tablet, Take 1 tablet by mouth at bedtime., Disp: , Rfl:    levothyroxine (SYNTHROID) 50 MCG tablet, Take 50 mcg by mouth daily., Disp: , Rfl:    predniSONE (DELTASONE) 10 MG tablet, Take 1 tablet by mouth daily., Disp: , Rfl:    predniSONE (DELTASONE) 5 MG tablet, Take by mouth., Disp: , Rfl:    tadalafil (CIALIS) 5 MG tablet, Take by mouth., Disp: , Rfl:   Past Medical History: No past medical history on file.  Tobacco Use: Social History   Tobacco Use  Smoking Status Former   Packs/day: 1.00   Types: Cigarettes   Quit date: 03/17/1953   Years since quitting: 69.2  Smokeless Tobacco Never    Labs: Review Flowsheet        No data to display           Pulmonary Assessment Scores:  Pulmonary Assessment Scores     Row Name 12/16/21 1707         ADL UCSD   ADL Phase Entry     SOB Score total 17     Rest 0     Walk 1     Stairs 1     Bath 0     Dress 0     Shop 1       CAT Score   CAT Score 4       mMRC Score   mMRC Score 1              UCSD: Self-administered rating of dyspnea  associated with activities of daily living (ADLs) 6-point scale (0 = "not at all" to 5 = "maximal or unable to do because of breathlessness")  Scoring Scores range from 0 to 120.  Minimally important difference is 5 units  CAT: CAT can identify the health impairment of COPD patients and is better correlated with disease progression.  CAT has a scoring range of zero to 40. The CAT score is classified into four groups of low (less than 10), medium (10 - 20), high (21-30) and very high (31-40) based on the impact level of disease on health status. A CAT score over 10 suggests significant symptoms.  A worsening CAT score could be explained by an exacerbation, poor medication adherence, poor inhaler technique, or progression of COPD or comorbid conditions.  CAT MCID is 2 points  mMRC: mMRC (Modified Medical Research Council) Dyspnea Scale is used to assess the degree of baseline functional disability in patients of respiratory disease due to dyspnea. No minimal important difference is established. A decrease in score of 1 point  or greater is considered a positive change.   Pulmonary Function Assessment:   Exercise Target Goals: Exercise Program Goal: Individual exercise prescription set using results from initial 6 min walk test and THRR while considering  patient's activity barriers and safety.   Exercise Prescription Goal: Initial exercise prescription builds to 30-45 minutes a day of aerobic activity, 2-3 days per week.  Home exercise guidelines will be given to patient during program as part of exercise prescription that the participant will acknowledge.  Education: Aerobic Exercise: - Group verbal and visual presentation on the components of exercise prescription. Introduces F.I.T.T principle from ACSM for exercise prescriptions.  Reviews F.I.T.T. principles of aerobic exercise including progression. Written material given at graduation. Flowsheet Row Pulmonary Rehab from 03/24/2022 in [facility]  Cardiac and Pulmonary Rehab  Education need identified 12/16/21  Date 03/24/22  Educator [place]  Instruction Review Code 1- Verbalizes Understanding       Education: Resistance Exercise: - Group verbal and visual presentation on the components of exercise prescription. Introduces F.I.T.T principle from ACSM for exercise prescriptions  Reviews F.I.T.T. principles of resistance exercise including progression. Written material given at graduation.    Education: Exercise & Equipment Safety: - Individual verbal instruction and demonstration of equipment use and safety with use of the equipment. Flowsheet Row Pulmonary Rehab from 03/24/2022 in [facility] Cardiac and Pulmonary Rehab  Education need identified 12/16/21  Date 12/16/21  Educator [place]  Instruction Review Code 1- Verbalizes Understanding       Education: Exercise Physiology & General Exercise Guidelines: - Group verbal and written instruction with models to review the exercise physiology of the cardiovascular system and associated critical values. Provides general exercise guidelines with specific guidelines to those with heart or lung disease.  Flowsheet Row Pulmonary Rehab from 03/24/2022 in [facility] Cardiac and Pulmonary Rehab  Date 01/13/22  Educator kl  Instruction Review Code 1- [employer] Understanding       Education: Flexibility, Balance, Mind/Body Relaxation: - Group verbal and visual presentation with interactive activity on the components of exercise prescription. Introduces F.I.T.T principle from ACSM for exercise prescriptions. Reviews F.I.T.T. principles of flexibility and balance exercise training including progression. Also discusses the mind body connection.  Reviews various relaxation techniques to help reduce and manage stress (i.e. Deep breathing, progressive muscle relaxation, and visualization). Balance handout provided to take home. Written material given at graduation.   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 HR 105 bpm 130 bpm    Max Ex. BP 152/72 154/64    2 Minute Post BP 132/76 --      Interval HR   1 Minute HR 79 111    2 Minute HR 94 117    3 Minute HR 105 130    4 Minute HR 105 124    5 Minute HR 100 127    6 Minute HR 105 123    2 Minute Post HR 70 94    Interval Heart Rate? Yes Yes      Interval Oxygen   Interval Oxygen? Yes Yes    Baseline Oxygen Saturation % 95 % 96 %    1 Minute Oxygen Saturation % 92 % 96 %    1 Minute Liters of Oxygen 0 L  RA 0 L  RA    2 Minute Oxygen Saturation % 90 % 89 %    2 Minute Liters of Oxygen 0 L 0 L    3 Minute Oxygen Saturation % 88 % 88 %    3 Minute Liters of Oxygen 0 L 0 L    4 Minute Oxygen Saturation % 89 % 87 %    4 Minute Liters of Oxygen 0 L 0 L    5 Minute Oxygen Saturation % 88 % 88 %    5 Minute Liters of Oxygen 0 L 0 L    6 Minute Oxygen Saturation % 87 % 88 %    6 Minute Liters of Oxygen 0 L 0 L    2 Minute Post Oxygen Saturation % 96 % 98 %    2 Minute Post Liters of Oxygen 0 L 0 L            Oxygen Initial Assessment:  Oxygen Initial Assessment - 04/26/22 1454       Home Oxygen   Home Oxygen Device None    Sleep Oxygen Prescription CPAP     Liters per minute 5    Home Exercise Oxygen Prescription None    Home Resting Oxygen Prescription None    Compliance with Home Oxygen Use Yes      Initial 6 min Walk   Oxygen Used None      Program Oxygen Prescription   Program Oxygen Prescription None      Intervention   Long  Term Goals Compliance with respiratory medication;Exhibits proper breathing techniques, such as pursed lip breathing or other method taught during program session;Maintenance of O2 saturations>88%;Verbalizes importance of monitoring SPO2 with pulse oximeter and return demonstration             Oxygen Re-Evaluation:  Oxygen Re-Evaluation     Row Name 12/30/21 1353 02/15/22 1426 03/08/22 1436 04/05/22 1343 04/26/22 1454     Program Oxygen Prescription   Program Oxygen Prescription None None None None --     Home Oxygen   Home Oxygen Device None None None None --   Sleep Oxygen Prescription CPAP CPAP CPAP CPAP --   Liters per minute _0 --   Home Exercise Oxygen Prescription None None None None --   Home Resting Oxygen Prescription None None None None --   Compliance with Home Oxygen Use Yes Yes Yes Yes --     Goals/Expected Outcomes   Short Term Goals To learn and understand importance of monitoring SPO2 with pulse oximeter and demonstrate accurate  use of the pulse oximeter.;To learn and understand importance of maintaining oxygen saturations>88%;To learn and demonstrate proper pursed lip breathing techniques or other breathing techniques. ;To learn and demonstrate proper use of respiratory medications To learn and understand importance of monitoring SPO2 with pulse oximeter and demonstrate accurate use of the pulse oximeter.;To learn and understand importance of maintaining oxygen saturations>88%;To learn and demonstrate proper pursed lip breathing techniques or other breathing techniques. ;To learn and demonstrate proper use of respiratory medications To learn and understand importance of monitoring  SPO2 with pulse oximeter and demonstrate accurate use of the pulse oximeter.;To learn and understand importance of maintaining oxygen saturations>88%;To learn and demonstrate proper pursed lip breathing techniques or other breathing techniques. ;To learn and demonstrate proper use of respiratory medications To learn and understand importance of monitoring SPO2 with pulse oximeter and demonstrate accurate use of the pulse oximeter.;To learn and understand importance of maintaining oxygen saturations>88%;To learn and demonstrate proper pursed lip breathing techniques or other breathing techniques. ;To learn and demonstrate proper use of respiratory medications To learn and understand importance of monitoring SPO2 with pulse oximeter and demonstrate accurate use of the pulse oximeter.;To learn and understand importance of maintaining oxygen saturations>88%;To learn and demonstrate proper pursed lip breathing techniques or other breathing techniques. ;To learn and demonstrate proper use of respiratory medications   Long  Term Goals Compliance with respiratory medication;Exhibits proper breathing techniques, such as pursed lip breathing or other method taught during program session;Maintenance of O2 saturations>88%;Verbalizes importance of monitoring SPO2 with pulse oximeter and return demonstration Compliance with respiratory medication;Exhibits proper breathing techniques, such as pursed lip breathing or other method taught during program session;Maintenance of O2 saturations>88%;Verbalizes importance of monitoring SPO2 with pulse oximeter and return demonstration Compliance with respiratory medication;Exhibits proper breathing techniques, such as pursed lip breathing or other method taught during program session;Maintenance of O2 saturations>88%;Verbalizes importance of monitoring SPO2 with pulse oximeter and return demonstration Compliance with respiratory medication;Exhibits proper breathing techniques, such as  pursed lip breathing or other method taught during program session;Maintenance of O2 saturations>88%;Verbalizes importance of monitoring SPO2 with pulse oximeter and return demonstration --   Comments -- Sean Waters is good about using his CPAP each night.  He sleeps better with it.  He is doing well on his meds.  His saturations continue to do well.  We will conitnue to check in with him. Sean Waters is good about using his CPAP each night.  He sleeps better with it.  He is doing well on his meds.  His saturations continue to do well.  He meets with a team of pulmonologists at [facility] in December to dig deeper into the status of his lung disease and hopefully get more answers. Sean Waters is doing well and has not needed to use his inhaler.  He did notice that the cooler weather has made it harder to breathe. He is trying to use his PLB.  He is staying compliant with he CPAP.  He recently changed to a full mask and still getting used to it. Sean Waters continues to do well. He continues to watch his O2 at home, he knows his saturations need to stay above 88%. During his walking, he tends to drop to about 88% but stops and rest and recovers well up to  96-98%. He is always reminded to use PLB and still uses CPAP. He is getting ready to graduate and is going to stay consistent with his exercise and watching his vitals closely. He knows to reach out to his doctors  if he sees or feels any different or notices any big changes.   Goals/Expected Outcomes -- Short: Conitnue to use meds and CPAP daily Long: conitnue to monitor saturations Short: Conitnue to use meds and CPAP daily. Have appointment with [facility] pulmonologists.  Long: conitnue to monitor saturations. Short: Adjust to new mask Long: continue to use PLB Short: Graduate Long: Continue to utilize PLB and monitor O2 saturations    Row Name 05/25/22 1121             Program Oxygen Prescription   Program Oxygen Prescription None         Home Oxygen   Home Oxygen Device None       Sleep  Oxygen Prescription CPAP       Liters per minute 5       Home Exercise Oxygen Prescription None       Home Resting Oxygen Prescription None       Compliance with Home Oxygen Use Yes         Goals/Expected Outcomes   Short Term Goals To learn and understand importance of monitoring SPO2 with pulse oximeter and demonstrate accurate use of the pulse oximeter.;To learn and understand importance of maintaining oxygen saturations>88%;To learn and demonstrate proper pursed lip breathing techniques or other breathing techniques. ;To learn and demonstrate proper use of respiratory medications       Long  Term Goals Compliance with respiratory medication;Exhibits proper breathing techniques, such as pursed lip breathing or other method taught during program session;Maintenance of O2 saturations>88%;Verbalizes importance of monitoring SPO2 with pulse oximeter and return demonstration       Comments Sean RungJoe is doing well with his breathing. He has been watching his O2 at home and knows that his saturations should stay above 88%. He was encouraged to check his O2 while exercising at home post-rehab. He is always practicing his PLB and has been sleeping well with his CPAP. He will continue to check his vitals while exercising after graduating from the program.       Goals/Expected Outcomes Short: Graduate. Long: Continue to practice PLB and monitor O2 saturations.                Oxygen Discharge (Final Oxygen Re-Evaluation):  Oxygen Re-Evaluation - 05/25/22 1121       Program Oxygen Prescription   Program Oxygen Prescription None      Home Oxygen   Home Oxygen Device None    Sleep Oxygen Prescription CPAP    Liters per minute 5    Home Exercise Oxygen Prescription None    Home Resting Oxygen Prescription None    Compliance with Home Oxygen Use Yes      Goals/Expected Outcomes   Short Term Goals To learn and understand importance of monitoring SPO2 with pulse oximeter and demonstrate accurate use of  the pulse oximeter.;To learn and understand importance of maintaining oxygen saturations>88%;To learn and demonstrate proper pursed lip breathing techniques or other breathing techniques. ;To learn and demonstrate proper use of respiratory medications    Long  Term Goals Compliance with respiratory medication;Exhibits proper breathing techniques, such as pursed lip breathing or other method taught during program session;Maintenance of O2 saturations>88%;Verbalizes importance of monitoring SPO2 with pulse oximeter and return demonstration    Comments Sean RungJoe is doing well with his breathing. He has been watching his O2 at home and knows that his saturations should stay above 88%. He was encouraged to check his O2 while exercising at home post-rehab. He is always  practicing his PLB and has been sleeping well with his CPAP. He will continue to check his vitals while exercising after graduating from the program.    Goals/Expected Outcomes Short: Graduate. Long: Continue to practice PLB and monitor O2 saturations.             Initial Exercise Prescription:  Initial Exercise Prescription - 12/16/21 1700       Date of Initial Exercise RX and Referring Provider   Date 12/16/21    Referring Provider Ned Clines MD      Oxygen   Maintain Oxygen Saturation 88% or higher      Treadmill   MPH 2    Grade 0.5    Minutes 15    METs 2.53      Recumbant Elliptical   Level 1    Watts 50    Minutes 15    METs 1.9      REL-XR   Level 1    Speed 50    Minutes 15    METs 1.9      Prescription Details   Frequency (times per week) 2    Duration Progress to 30 minutes of continuous aerobic without signs/symptoms of physical distress      Intensity   THRR 40-80% of Max Heartrate 95 - 124    Ratings of Perceived Exertion 11-13    Perceived Dyspnea 0-4      Progression   Progression Continue to progress workloads to maintain intensity without signs/symptoms of physical distress.       Resistance Training   Training Prescription Yes    Weight 4 lb    Reps 10-15             Perform Capillary Blood Glucose checks as needed.  Exercise Prescription Changes:   Exercise Prescription Changes     Row Name 12/16/21 1700 12/28/21 1100 01/04/22 1500 01/11/22 1400 01/27/22 0900     Response to Exercise   Blood Pressure (Admit) 140/74 106/64 -- 124/64 138/66   Blood Pressure (Exercise) 166/74 148/68 -- 118/58 144/62   Blood Pressure (Exit) 132/76 120/70 -- 118/62 110/60   Heart Rate (Admit) 66 bpm 75 bpm -- 92 bpm 100 bpm   Heart Rate (Exercise) 105 bpm 109 bpm -- 97 bpm 106 bpm   Heart Rate (Exit) 70 bpm 89 bpm -- 90 bpm 81 bpm   Oxygen Saturation (Admit) 95 % 94 % -- 94 % 93 %   Oxygen Saturation (Exercise) 88 % 88 % -- 89 % 90 %   Oxygen Saturation (Exit) 96 % 91 % -- 94 % 91 %   Rating of Perceived Exertion (Exercise) 11 11 -- 12 11   Perceived Dyspnea (Exercise) 0 -- -- 0 0   Symptoms none none -- none none   Comments results 2nd full day of exercise -- -- --   Duration -- Progress to 30 minutes of  aerobic without signs/symptoms of physical distress -- Continue with 30 min of aerobic exercise without signs/symptoms of physical distress. Continue with 30 min of aerobic exercise without signs/symptoms of physical distress.   Intensity -- THRR unchanged -- THRR unchanged THRR unchanged     Progression   Progression -- Continue to progress workloads to maintain intensity without signs/symptoms of physical distress. -- Continue to progress workloads to maintain intensity without signs/symptoms of physical distress. Continue to progress workloads to maintain intensity without signs/symptoms of physical distress.   Average METs -- 2.39 -- 2.46 2.6  Resistance Training   Training Prescription -- Yes -- Yes Yes   Weight -- 4 lb -- 5 lb 5 lb   Reps -- 10-15 -- 10-15 10-15     Interval Training   Interval Training -- No -- No No     Treadmill   MPH -- 2 -- 2  --   Grade -- 0.5 -- 0.5 --   Minutes -- 15 -- 15 --   METs -- 2.67 -- 2.67 --     Recumbant Bike   Level -- -- -- -- 3.5   Minutes -- -- -- -- 15   METs -- -- -- -- 3.3     NuStep   Level -- -- -- 4 6   Minutes -- -- -- 15 15   METs -- -- -- 2.2 2.2     Recumbant Elliptical   Level -- 1.4 -- 1.4 --   Minutes -- 15 -- 15 --     REL-XR   Level -- 3 -- 3 --   Minutes -- 15 -- 15 --   METs -- 2.2 -- 2 --     Biostep-RELP   Level -- -- -- 1 --   Minutes -- -- -- 15 --   METs -- -- -- 3 --     Home Exercise Plan   Plans to continue exercise at -- -- Home (comment)  recumbent bike, stepper, walking Home (comment)  recumbent bike, stepper, walking Home (comment)  recumbent bike, stepper, walking   Frequency -- -- Add 3 additional days to program exercise sessions. Add 3 additional days to program exercise sessions. Add 3 additional days to program exercise sessions.   Initial Home Exercises Provided -- -- 01/04/22 01/04/22 01/04/22     Oxygen   Maintain Oxygen Saturation -- 88% or higher 88% or higher 88% or higher 88% or higher    Row Name 02/23/22 1000 03/08/22 1600 03/22/22 1400 04/05/22 1400 04/20/22 1400     Response to Exercise   Blood Pressure (Admit) 136/64 118/68 126/64 110/60 140/60   Blood Pressure (Exit) 112/56 132/80 102/70 110/64 128/60   Heart Rate (Admit) 87 bpm 80 bpm 89 bpm 80 bpm 88 bpm   Heart Rate (Exercise) 113 bpm 114 bpm 118 bpm 123 bpm 126 bpm   Heart Rate (Exit) 70 bpm 85 bpm 88 bpm 88 bpm 90 bpm   Oxygen Saturation (Admit) 96 % 96 % 94 % 96 % 92 %   Oxygen Saturation (Exercise) 87 % 90 % 88 % 91 % 88 %   Oxygen Saturation (Exit) 95 % 95 % 96 % 94 % 94 %   Rating of Perceived Exertion (Exercise) 12 12 12 11 13    Perceived Dyspnea (Exercise) 1 1 0 0 0   Symptoms none none none none none   Duration Continue with 30 min of aerobic exercise without signs/symptoms of physical distress. Continue with 30 min of aerobic exercise without signs/symptoms of  physical distress. Continue with 30 min of aerobic exercise without signs/symptoms of physical distress. Continue with 30 min of aerobic exercise without signs/symptoms of physical distress. Continue with 30 min of aerobic exercise without signs/symptoms of physical distress.   Intensity THRR unchanged THRR unchanged THRR unchanged THRR unchanged THRR unchanged     Progression   Progression Continue to progress workloads to maintain intensity without signs/symptoms of physical distress. Continue to progress workloads to maintain intensity without signs/symptoms of physical distress. Continue to progress workloads to maintain intensity  without signs/symptoms of physical distress. Continue to progress workloads to maintain intensity without signs/symptoms of physical distress. Continue to progress workloads to maintain intensity without signs/symptoms of physical distress.   Average METs 3.17 3.5 3.4 -- 2.61     Resistance Training   Training Prescription Yes Yes Yes Yes Yes   Weight 5 lb 5 lb 5 lb 5 lb 5 lb   Reps 10-15 10-15 10-15 10-15 10-15     Interval Training   Interval Training No No No No No     Treadmill   MPH 2.5 2.5 2.5 2.5 2.5   Grade 0.5 0.5 0.5 1 0.5   Minutes 15 15 15 15 15    METs 3.09 3.09 3.2 3.26 3.09     Recumbant Bike   Level 4 3.8 8 -- --   Minutes 15 15 15  -- --   METs 2.94 3.6 3.2 -- --     NuStep   Level 5 6 6  -- --   Minutes 15 15 15  -- --   METs 3.1 2.9 3.1 -- --     REL-XR   Level 9 8 8 8 8    Minutes 15 15 15 15 15    METs 3.9 4.9 4.7 3.9 2.2     Home Exercise Plan   Plans to continue exercise at Home (comment)  recumbent bike, stepper, walking Home (comment)  recumbent bike, stepper, walking Home (comment)  recumbent bike, stepper, walking Home (comment)  recumbent bike, stepper, walking Home (comment)  recumbent bike, stepper, walking   Frequency Add 3 additional days to program exercise sessions. Add 3 additional days to program exercise sessions. Add  3 additional days to program exercise sessions. Add 3 additional days to program exercise sessions. Add 3 additional days to program exercise sessions.   Initial Home Exercises Provided 01/04/22 01/04/22 01/04/22 01/04/22 01/04/22     Oxygen   Maintain Oxygen Saturation 88% or higher 88% or higher 88% or higher 88% or higher 88% or higher    Row Name 05/03/22 1100 05/18/22 1400 05/31/22 1000         Response to Exercise   Blood Pressure (Admit) 128/64 118/60 144/72     Blood Pressure (Exercise) 154/64 -- --     Blood Pressure (Exit) 126/62 102/60 130/60     Heart Rate (Admit) 91 bpm 76 bpm 90 bpm     Heart Rate (Exercise) 123 bpm 104 bpm 126 bpm     Heart Rate (Exit) 101 bpm 78 bpm 98 bpm     Oxygen Saturation (Admit) 97 % 95 % 94 %     Oxygen Saturation (Exercise) 89 % 88 % 88 %     Oxygen Saturation (Exit) 95 % 94 % 94 %     Rating of Perceived Exertion (Exercise) 12 11 13      Perceived Dyspnea (Exercise) 0 0 0     Symptoms none none none     Duration Continue with 30 min of aerobic exercise without signs/symptoms of physical distress. Continue with 30 min of aerobic exercise without signs/symptoms of physical distress. Continue with 30 min of aerobic exercise without signs/symptoms of physical distress.     Intensity THRR unchanged THRR unchanged THRR unchanged       Progression   Progression Continue to progress workloads to maintain intensity without signs/symptoms of physical distress. Continue to progress workloads to maintain intensity without signs/symptoms of physical distress. Continue to progress workloads to maintain intensity without signs/symptoms of physical distress.  Average METs 3.08 2.56 2.95       Resistance Training   Training Prescription Yes Yes Yes     Weight 5 lb 5 lb 5 lb     Reps 10-15 10-15 10-15       Interval Training   Interval Training No No No       Treadmill   MPH 2.5 2 2.3     Grade 1 1 0.5     Minutes 15 15 15      METs 3.26 2.81 2.92        Recumbant Bike   Level -- -- 5     Watts -- -- 26     Minutes -- -- 15     METs -- -- 2.95       NuStep   Level -- -- 6     Minutes -- -- 15     METs -- -- 3.1       REL-XR   Level 7 7 7      Minutes 15 15 15      METs 4.1 2.3 --       Home Exercise Plan   Plans to continue exercise at Home (comment)  recumbent bike, stepper, walking Home (comment)  recumbent bike, stepper, walking Home (comment)  recumbent bike, stepper, walking     Frequency Add 3 additional days to program exercise sessions. Add 3 additional days to program exercise sessions. Add 3 additional days to program exercise sessions.     Initial Home Exercises Provided 01/04/22 01/04/22 01/04/22       Oxygen   Maintain Oxygen Saturation 88% or higher 88% or higher 88% or higher              Exercise Comments:   Exercise Goals and Review:   Exercise Goals     Row Name 12/16/21 1720             Exercise Goals   Increase Physical Activity Yes       Intervention Provide advice, education, support and counseling about physical activity/exercise needs.;Develop an individualized exercise prescription for aerobic and resistive training based on initial evaluation findings, risk stratification, comorbidities and participant's personal goals.       Expected Outcomes Short Term: Attend rehab on a regular basis to increase amount of physical activity.;Long Term: Add in home exercise to make exercise part of routine and to increase amount of physical activity.;Long Term: Exercising regularly at least 3-5 days a week.       Increase Strength and Stamina Yes       Intervention Provide advice, education, support and counseling about physical activity/exercise needs.;Develop an individualized exercise prescription for aerobic and resistive training based on initial evaluation findings, risk stratification, comorbidities and participant's personal goals.       Expected Outcomes Short Term: Increase workloads from  initial exercise prescription for resistance, speed, and METs.;Short Term: Perform resistance training exercises routinely during rehab and add in resistance training at home;Long Term: Improve cardiorespiratory fitness, muscular endurance and strength as measured by increased METs and functional capacity ( )       Able to understand and use rate of perceived exertion (RPE) scale Yes       Intervention Provide education and explanation on how to use RPE scale       Expected Outcomes Short Term: Able to use RPE daily in rehab to express subjective intensity level;Long Term:  Able to use RPE to guide intensity level when exercising independently  Able to understand and use Dyspnea scale Yes       Intervention Provide education and explanation on how to use Dyspnea scale       Expected Outcomes Short Term: Able to use Dyspnea scale daily in rehab to express subjective sense of shortness of breath during exertion;Long Term: Able to use Dyspnea scale to guide intensity level when exercising independently       Knowledge and understanding of Target Heart Rate Range (THRR) Yes       Intervention Provide education and explanation of THRR including how the numbers were predicted and where they are located for reference       Expected Outcomes Short Term: Able to state/look up THRR;Long Term: Able to use THRR to govern intensity when exercising independently;Short Term: Able to use daily as guideline for intensity in rehab       Able to check pulse independently Yes       Intervention Review the importance of being able to check your own pulse for safety during independent exercise;Provide education and demonstration on how to check pulse in carotid and radial arteries.       Expected Outcomes Short Term: Able to explain why pulse checking is important during independent exercise;Long Term: Able to check pulse independently and accurately       Understanding of Exercise Prescription Yes        Intervention Provide education, explanation, and written materials on patient's individual exercise prescription       Expected Outcomes Short Term: Able to explain program exercise prescription;Long Term: Able to explain home exercise prescription to exercise independently                Exercise Goals Re-Evaluation :  Exercise Goals Re-Evaluation     Row Name 12/28/21 1159 12/30/21 1344 01/04/22 1509 01/11/22 1447 01/27/22 0950     Exercise Goal Re-Evaluation   Exercise Goals Review Increase Physical Activity;Increase Strength and Stamina;Understanding of Exercise Prescription Increase Physical Activity;Increase Strength and Stamina;Understanding of Exercise Prescription Increase Physical Activity;Increase Strength and Stamina;Understanding of Exercise Prescription Increase Physical Activity;Increase Strength and Stamina;Understanding of Exercise Prescription Increase Physical Activity;Increase Strength and Stamina;Understanding of Exercise Prescription   Comments Sean Waters is doing well for the first couple of sessions that he has been here. His oxygen did drop to 88% but he knows to be aware and to stop and PLB when he does. He worked at level 3 on the XR and RPE was appropriate. We will continue to monitor as he progresses in the program. For exercise he will use the stepper and recumbent bike at home: he alternates the time from 1.5 hours and 30 minutes alternating days. He uses a pulse ox to monitor his HR and O2; he keeps his HR around 100-110 during exercise, his O2 stays around 92%. Reviewed that his target HR is 95-124. Reviewed home exercise with pt today.  Pt is already exercising on his stepper and recumbent bike for exercise. He exercises about 1 hour and alternates with 30 minutes every other day. We did talk about incorporating structured walking to switch up his routine. He is not interested in taking rest days after discussing the importance. He does home exercise on top of rehab in the  same day and made sure he is aware that he is not overworking his muscles and that he is also intaking the appropriate and right amount of foods. He is deffering nutrition at this time. He works up to "amount of calories" and  recommended to switch more to base off of RPE and HR. He states he usually reached 100-105 bpm HR during his exercise at home.   He is watching his O2 sats at home as he drops sometime during rehab. Reviewed THR, pulse, RPE, sign and symptoms, pulse oximetery and when to call 911 or MD. Also discussed weather considerations and indoor options.  Pt voiced understanding. Sean Waters is doing well in rehab.  He is up to 5 lb hand weights and 3 METs on the BioStep.  We will continue to monitor his progress. Sean Waters is doing well in rehab. He improved to level 6 on the T4. He also began using the recumbent bike and did well with level 3.5. He also increased his overall average MET levelt o 2.6 METs. We will continue to monitor his progress in the program.   Expected Outcomes Short: Continue current exercise prescription and watch O2 closely Long: Increase overall MET level Short: Continue current exercise prescription and watch O2 closely Long: Increase overall MET level Short: Watch O2 closely at home, incorporate some walking to switch up routine Long: Exercise independently at home at appropriate prescription Short: Begin to increase workloads Long: Continue to improve stamina Short: Continue to increase workloads Long: Continue to improve strength and stamina    Row Name 02/09/22 1547 02/15/22 1415 02/23/22 1037 03/08/22 1424 03/08/22 1610     Exercise Goal Re-Evaluation   Exercise Goals Review Increase Physical Activity;Increase Strength and Stamina;Understanding of Exercise Prescription Increase Physical Activity;Increase Strength and Stamina;Understanding of Exercise Prescription Increase Physical Activity;Increase Strength and Stamina;Understanding of Exercise Prescription Increase Physical  Activity;Increase Strength and Stamina;Understanding of Exercise Prescription Increase Physical Activity;Increase Strength and Stamina;Understanding of Exercise Prescription   Comments Sean Waters has not been here since last reivew as he has been out of town. We hope to see his attendance improve as returns back on 9/27. Sean Waters returned today.  He is feeling good with his exercise. He is walking and building a wall in his bathroom on his off days to keep up with his exercise.  He does feel like his strength and stamina are starting to recover. Sean Waters is doing well with exercise. He recently increased his overall average MET level to 3.17 METs. He also improved to level 4 on the recumbent bike and level 9 on the XR. He has tolerated the treadmill at a speed of 2.5 mph and an incline of 0.5% as well. We will continue to monitor his progress in the program. Sean Waters reports that he feels like he is doing well and feeling like he is breathing better and is stronger. He reports that he exercises at home almost every day using his stepper and recumbant bike and following the home exercise guidelines provided to him by the pumonary rehab program. He also attends rehab classes consistently and continues to make progress with his workloads. He feels that his pulse ox at home does not read accurately when he is exercising, so it was recommended to him to bring it into class and make sure it is consistently reading with the pulse ox he wears in class. Sean Waters is doing well in rehab. He did go back to level 6 on the T4 Nustep. He also worked up to almost 5 METS on the XR! Oxygen saturations are staying above 88%. He would benefit from increasing more incline on the treadmill. We will continue to monitor.   Expected Outcomes Short: Maintain routine attendance Long: Finish [facility] Program Short: Continue to exericse on off  days Long: conitnue to improve stamina Short: Continue to increase workload on the treadmill. Long: conitnue to improve  stamina Short: check accuracy of pulse ox he uses at home. Long: continue to improve stamina and become independent with exercise routine, while monitoring oxygen satruations. Short: Increase incline on treadmill Long: Continue to increase overall MET level    Row Name 03/22/22 1447 04/05/22 1341 04/05/22 1436 04/20/22 1455 04/26/22 1423     Exercise Goal Re-Evaluation   Exercise Goals Review Increase Physical Activity;Increase Strength and Stamina;Understanding of Exercise Prescription Increase Physical Activity;Increase Strength and Stamina;Understanding of Exercise Prescription Increase Physical Activity;Increase Strength and Stamina;Understanding of Exercise Prescription Increase Physical Activity;Increase Strength and Stamina;Understanding of Exercise Prescription Increase Physical Activity;Increase Strength and Stamina;Understanding of Exercise Prescription   Comments Sean Waters is doing well in rehab. He has consistently kept his MET level above 3 METs. He has also improved to level 8 on the recumbent bike and continued with level 8 on the XR. We will continue to monitor his progress in the program. Sean Waters is doing well in rehab.  He is feeling good with his exercise.  On his off days he is exercise for an hour and an additional half hour on the days he comes to rehab.  He does feel like his strength and stamina are getting better.  He feels like he is able to do more before getting too SOB. Sean Waters continues to do well in rehab. He increased to  a 1% incline on the treadmill and was able to work over 3.2 METS.  He has been consistently working at level 8 on the XR which he tolerates well.  He is reaching his THR each session. Will continue to monitor. Sean Waters is doing well in rehab. He due for his post 6MWT and will look to improve on that. He also has consistently worked at level 8 on the XR. He has continued to walk on the treadmill at a speed of 2.5 mph but lowered his incline from 1% to 0.5%. We will continue to  monitor his progress in the program. Sean Waters completed his post 6MWT and improved by 28.4%. His max HR increased and his lowest O2 report was only 1% lower than his pre 6MWT. Overall, Sean Waters feels good; he has always exercised consistently before he came and is happy that he has been able to keep up with it. He is very diligent about his home exercise as well, where he will be exercising on his stepper and recumbent bike. Encouraged to keep up with the walking as well. He keeps his HR between 100-110 bpm which is hitting his THR. He would benefit from adding in more weight training with hand weights or bands for home use! He will be graduating in January when he comes back from vacation.   Expected Outcomes Short: Continue to increase workloads as tolerated. Long: Continue to increase strength and stamina. Short: continue to exericse on off days Long: Continue to exercise indpendently Short: Continue to slowly increase treadmill workload  Long: Continue to increase overall MET level Short: Continue to increase treadmill workload. Long: Continue to increase strength and stamina. Short: Graduate Long: Continue to exercise independently at home    [place] Name 05/03/22 1147 05/18/22 1417 05/25/22 1109 05/31/22 1031       Exercise Goal Re-Evaluation   Exercise Goals Review Increase Physical Activity;Increase Strength and Stamina;Understanding of Exercise Prescription Increase Physical Activity;Increase Strength and Stamina;Understanding of Exercise Prescription Increase Physical Activity;Increase Strength and Stamina;Understanding of Exercise Prescription Increase  Physical Activity;Increase Strength and Stamina;Understanding of Exercise Prescription    Comments Sean Waters completed hist post and is soon to graduate in January once he returns from vacation. We will encourage him to to increase his XR level back up as he went down last time. We also will encouraged an increase on his treadmill incline. O2 saturations are still  staying in range and he consistently hits his THR. Will continur to monitor up until graduation. Sean Waters has only attended rehab once since the last review. He has already completed his post and will graduate once he returns from vacation. He has kept his workloads consistent at level 7 on the XR, and a speed of 2 mph on the treadmill. We will continue to monitor his progress. Sean Waters has done well in the program and only has a couple of sessions left before graduation. He states that he feels he has done well with the program and accomplished his exercise goals. He states that he has enjoyed learning about his oxygen staurations and how to monitor it during exercise. He plans to exercise at home by using his personal recumbent bike and other aerobic machines. Sean Waters continues to do well in rehab. He improved on his post  by 28%! He tried out the recumbent bike and was able to work on level 5. He is graduating today and patient will continue to exercise independently at home on his own equipment.    Expected Outcomes Short: Increase XR level back to 8 Long: Continue to increase overall MET level Short: Graduate. Long: Continue to increase strength and stamina. Short: Graduate. Long: Continue to exercise independently. Short: Graduate Long: Exercise independently at home             Discharge Exercise Prescription (Final Exercise Prescription Changes):  Exercise Prescription Changes - 05/31/22 1000       Response to Exercise   Blood Pressure (Admit) 144/72    Blood Pressure (Exit) 130/60    Heart Rate (Admit) 90 bpm    Heart Rate (Exercise) 126 bpm    Heart Rate (Exit) 98 bpm    Oxygen Saturation (Admit) 94 %    Oxygen Saturation (Exercise) 88 %    Oxygen Saturation (Exit) 94 %    Rating of Perceived Exertion (Exercise) 13    Perceived Dyspnea (Exercise) 0    Symptoms none    Duration Continue with 30 min of aerobic exercise without signs/symptoms of physical distress.    Intensity THRR  unchanged      Progression   Progression Continue to progress workloads to maintain intensity without signs/symptoms of physical distress.    Average METs 2.95      Resistance Training   Training Prescription Yes    Weight 5 lb    Reps 10-15      Interval Training   Interval Training No      Treadmill   MPH 2.3    Grade 0.5    Minutes 15    METs 2.92      Recumbant Bike   Level 5    Watts 26    Minutes 15    METs 2.95      NuStep   Level 6    Minutes 15    METs 3.1      REL-XR   Level 7    Minutes 15      Home Exercise Plan   Plans to continue exercise at Home (comment)   recumbent bike, stepper, walking  Frequency Add 3 additional days to program exercise sessions.    Initial Home Exercises Provided 01/04/22      Oxygen   Maintain Oxygen Saturation 88% or higher             Nutrition:  Target Goals: Understanding of nutrition guidelines, daily intake of sodium 1500mg , cholesterol 200mg , calories 30% from fat and 7% or less from saturated fats, daily to have 5 or more servings of fruits and vegetables.  Education: All About Nutrition: -Group instruction provided by verbal, written material, interactive activities, discussions, models, and posters to present general guidelines for heart healthy nutrition including fat, fiber, MyPlate, the role of sodium in heart healthy nutrition, utilization of the nutrition label, and utilization of this knowledge for meal planning. Follow up email sent as well. Written material given at graduation.   Biometrics:  Pre Biometrics - 12/16/21 1708       Pre Biometrics   Height 5\' 4"  (1.626 m)    Weight 185 lb 3.2 oz (84 kg)    BMI (Calculated) 31.77    Single Leg Stand 24.3 seconds             Post Biometrics - 04/26/22 1501        Post  Biometrics   Height 5\' 4"  (1.626 m)    Weight 187 lb 11.2 oz (85.1 kg)    BMI (Calculated) 32.2    Single Leg Stand 24.5 seconds             Nutrition Therapy  Plan and Nutrition Goals:  Nutrition Therapy & Goals - 04/26/22 1446       Nutrition Therapy   RD appointment deferred Yes             Nutrition Assessments:  MEDIFICTS Score Key: ?70 Need to make dietary changes  40-70 Heart Healthy Diet ? 40 Therapeutic Level Cholesterol Diet  Flowsheet Row [facility]  Picture Your Plate Total Score on Admission 68      Picture Your Plate Scores: <09<40 Unhealthy dietary pattern with much room for improvement. 41-50 Dietary pattern unlikely to meet recommendations for good health and room for improvement. 51-60 More healthful dietary pattern, with some room for improvement.  >60 Healthy dietary pattern, although there may be some specific behaviors that could be improved.   Nutrition Goals Re-Evaluation:  Nutrition Goals Re-Evaluation     Row Name 02/15/22 1419 03/08/22 1430 04/05/22 1347 04/26/22 1446 05/25/22 1116     Goals   Nutrition Goal Continues to defer appointments Continues to defer appointments Continues to defer appointments Continues to defer appointments Continues to defer appointments   Comment Sean Waters is feeling good about his diet. Sean Waters is feeling good about his diet. Sean Waters is feeling good about his diet.  He continues to watch his sugar and intake and limits his snacking.  His guilty pleasure is peanuts!!  He tries to be good. -- Sean RungJoe is feeling good about his diet. He continues to watch his sugar and intake and limits his snacking. He plans to continue to stay disciplined regarding his diet after graduating from the program.   Expected Outcome Continue to focus on healthy eating. Continue to focus on healthy eating. Continue to focus on healthy eating -- Long: Continue to watch sugar intake and limit snacking. Long: Continue to focus on healthy eating            Nutrition Goals Discharge (Final Nutrition Goals Re-Evaluation):  Nutrition Goals Re-Evaluation - 05/25/22 1116        Goals   Nutrition Goal Continues to defer appointments    Comment Sean Waters is feeling good about his diet. He continues to watch his sugar and intake and limits his snacking. He plans to continue to stay disciplined regarding his diet after graduating from the program.    Expected Outcome Long: Continue to watch sugar intake and limit snacking. Long: Continue to focus on healthy eating             Psychosocial: Target Goals: Acknowledge presence or absence of significant depression and/or stress, maximize coping skills, provide positive support system. Participant is able to verbalize types and ability to use techniques and skills needed for reducing stress and depression.   Education: Stress, Anxiety, and Depression - Group verbal and visual presentation to define topics covered.  Reviews how body is impacted by stress, anxiety, and depression.  Also discusses healthy ways to reduce stress and to treat/manage anxiety and depression.  Written material given at graduation. Flowsheet Row Pulmonary Rehab from 03/24/2022 in [facility]  Date 03/10/22  Educator KL  Instruction Review Code 1- Verbalizes Understanding       Education: Sleep Hygiene -Provides group verbal and written instruction about how sleep can affect your health.  Define sleep hygiene, discuss sleep cycles and impact of sleep habits. Review good sleep hygiene tips.    Initial Review & Psychosocial Screening:  Initial Psych Review & Screening - 12/03/21 1329       Initial Review   Current issues with Current Stress Concerns    Source of Stress Concerns Chronic Illness    Comments He reports feeling stress with his diagnosis. He has called a patient advocate and told them what Dr. Meredeth Ide said about his prognosis and that he didn't feel like he was supported; he didn't have someone on staff to talk to after being given his prognosis. When he got home he was depressed, lost 15 lbs, and thought he  was going to die. He was looking for guidance and didn't feel like he got it - he spoke to patient relations and did not get a response. He reports that he was diagnosed in March and he was the one to ask for a referall to rehab.      Family Dynamics   Good Support System? 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     Barriers   Psychosocial barriers to participate in program The patient should benefit from training in stress management and relaxation.      Screening Interventions   Interventions Encouraged to exercise;Provide feedback about the scores to participant;To provide support and resources with identified psychosocial needs    Expected Outcomes Short Term goal: Utilizing psychosocial counselor, staff and physician to assist with identification of specific Stressors or current issues interfering with healing process. Setting desired goal for each stressor or current issue identified.;Long Term Goal: Stressors or current issues are controlled or eliminated.;Short Term goal: Identification and review with participant of any Quality of Life or Depression concerns found by scoring the questionnaire.;Long Term goal: The participant improves quality of Life and PHQ9 Scores as seen by post scores and/or verbalization of changes             Quality of Life Scores:  Scores of 19 and below usually indicate a poorer quality of life  in these areas.  A difference of  2-3 points is a clinically meaningful difference.  A difference of 2-3 points in the total score of the Quality of Life Index has been associated with significant improvement in overall quality of life, self-image, physical symptoms, and general health in studies assessing change in quality of life.  PHQ-9: Review Flowsheet       12/16/2021  Depression screen PHQ 2/9  Decreased Interest 0  Down, Depressed, Hopeless 0  PHQ - 2 Score 0  Altered sleeping 0  Tired,  decreased energy 1  Change in appetite 0  Feeling bad or failure about yourself  0  Trouble concentrating 0  Moving slowly or fidgety/restless 0  Suicidal thoughts 0  PHQ-9 Score 1  Difficult doing work/chores Somewhat difficult   Interpretation of Total Score  Total Score Depression Severity:  1-4 = Minimal depression, 5-9 = Mild depression, 10-14 = Moderate depression, 15-19 = Moderately severe depression, 20-27 = Severe depression   Psychosocial Evaluation and Intervention:  Psychosocial Evaluation - 12/03/21 1433       Psychosocial Evaluation & Interventions   Interventions Stress management education;Relaxation education;Encouraged to exercise with the program and follow exercise prescription    Comments He reports feeling stress with his diagnosis. He has called a patient advocate and told them what Dr. Meredeth Ide said about his prognosis and that he didn't feel like he was supported; he didn't have someone on staff to talk to after being given his prognosis. When he got home he was depressed, lost 15 lbs, and thought he was going to die. He was looking for guidance and didn't feel like he got it - he spoke to patient relations and did not get a response. He reports that he was diagnosed in March and he was the one to ask for a referall to rehab. 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 He also has two children and 6 grandchildren in [place] who he visits. He has been retired and is still working on relaxing in the afternoon instead of being productive; in the mornings he exercises and plays the piano. For exercise he will use the stepper and recumbent bike at home: he alternates the time from 1.5 hours and 30 minutes alternating days. He uses a pulse ox to monitor his HR and O2; he keeps his HR around 100 during exercise. He is excited to start Pulmonary Rehab.    Expected  Outcomes ST: attend all scheduled exercise/education appointments, progress with exercise prescription while maintaining SpO2 <88%  LT: Improve shortness of breath with ADLs    Continue Psychosocial Services  Follow up required by staff             Psychosocial Re-Evaluation:  Psychosocial Re-Evaluation     Row Name 12/30/21 1343 12/30/21 1403 02/15/22 1416 03/08/22 1430 04/05/22 1349     Psychosocial Re-Evaluation   Current issues with Current Stress Concerns -- Current Stress Concerns Current Stress Concerns None Identified;Current Stress Concerns   Comments Sean Waters reports that his wife is also his patient advocate when he goes to the doctor and he is a Pharmacist, 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 He also has two children and 6 grandchildren in [place] who he visits. He has been retired and is still working on relaxing in the afternoon instead of being productive; in  the mornings he exercises and plays the piano. 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 He feels better about his diagnosis since being on medications and starting rehab as he feels it is helping. he is still upset with how he feels like he was treated when given his diagnosis and would like to go back in person to talk to someone about it. -- Sean Waters is doing well in rehab.  He was out for a bit, but now doing well.  He feels good overall.  He is sleeping good for most part.  He is good about using his CPAP each night.  He will get up once, with one trip to bathroom.  He is usually able to get back to sleep.  He is still playing the piano as an outlet. Patient reports no new changes in stress, sleep, or mental health concerns. He reports that we continues to wear his CPAP and sleeps well most nights. Sean Waters is doing well in rehab.  He is consistent with using his CPAP at night and has recently switched to a full mask that he is gettting used to.  He is  doing well mentally for most part.  He has down days, but tries to stay positive and do with what he's got.  He is looking forward to meeting with pulmonolgist next month for full work up.  He is learning his limits.  He is pleased with staying on course and feeling good again.  He no longer feels like he is looking at a death sentence.   Expected Outcomes ST: continue to attend rehab for mental health boost LT: continue to engage in stress reducing activites -- Short: Continue to use piano as a stress release Long: continue to stay positive Short: Continue to use piano as a stress release, and continue to use CPAP to aid in quality sleep.  Long: continue to stay positive Short: Continue to take each day at a time and look forward to pulmonolgy work up.  Long: continue to stay positive   Interventions Encouraged to attend Pulmonary Rehabilitation for the exercise -- Encouraged to attend Pulmonary Rehabilitation for the exercise Encouraged to attend Pulmonary Rehabilitation for the exercise Encouraged to attend Pulmonary Rehabilitation for the exercise   Continue Psychosocial Services  Follow up required by staff -- -- Follow up required by staff Follow up required by staff    Row Name 04/26/22 1439 05/25/22 1113           Psychosocial Re-Evaluation   Current issues with None Identified --      Comments Sean Waters continues to do well here at rehab. He is getting close to graduating and just finished his post . Overall, he feels really good; he is diligent about checking his HR and O2 and home. He is going on a vacation with his wife starting tomorrow which he is very excited about and then also traveling for Christmas the week after. He is getting another walk test done and further testing with his doctor the 1st week of January and is hoping to hear good results. He is pleased his prednisone decreased from 15 down to . He just finished up schooling for his music class as uses that as his stress relief  and keep his brain in top top shape. Denies other issues or concerns at this time. He will be ready to graduate when he comes back  from vacation! Sean Waters states that he is doing well with his mental health. He does state that he feels discouraged at times when he thinks about his condition. He states that exercise and coming to rehab has been great stress relievers for him. He plans to continue to exercise after graduating for stress relief.      Expected Outcomes Short: Graduate Long: Continue to maintain positive attitude Short: Graduate Long: Continue to maintain positive attitude      Interventions Encouraged to attend Pulmonary Rehabilitation for the exercise Encouraged to attend Pulmonary Rehabilitation for the exercise      Continue Psychosocial Services  Follow up required by staff Follow up required by staff        Initial Review   Comments -- He reports feeling stress with his diagnosis. He has called a patient advocate and told them what Dr. Meredeth Ide said about his prognosis and that he didn't feel like he was supported; he didn't have someone on staff to talk to after being given his prognosis. When he got home he was depressed, lost 15 lbs, and thought he was going to die. He was looking for guidance and didn't feel like he got it - he spoke to patient relations and did not get a response. He reports that he was diagnosed in March and he was the one to ask for a referall to rehab.               Psychosocial Discharge (Final Psychosocial Re-Evaluation):  Psychosocial Re-Evaluation - 05/25/22 1113       Psychosocial Re-Evaluation   Comments Sean Waters states that he is doing well with his mental health. He does state that he feels discouraged at times when he thinks about his condition. He states that exercise and coming to rehab has been great stress relievers for him. He plans to continue to exercise after graduating for stress relief.    Expected Outcomes Short: Graduate Long: Continue to  maintain positive attitude    Interventions Encouraged to attend Pulmonary Rehabilitation for the exercise    Continue Psychosocial Services  Follow up required by staff      Initial Review   Comments He reports feeling stress with his diagnosis. He has called a patient advocate and told them what Dr. Meredeth Ide said about his prognosis and that he didn't feel like he was supported; he didn't have someone on staff to talk to after being given his prognosis. When he got home he was depressed, lost 15 lbs, and thought he was going to die. He was looking for guidance and didn't feel like he got it - he spoke to patient relations and did not get a response. He reports that he was diagnosed in March and he was the one to ask for a referall to rehab.             Education: Education Goals: Education classes will be provided on a weekly basis, covering required topics. Participant will state understanding/return demonstration of topics presented.  Learning Barriers/Preferences:   General Pulmonary Education Topics:  Infection Prevention: - Provides verbal and written material to individual with discussion of infection control including proper hand washing and proper equipment cleaning during exercise session. Flowsheet Row Pulmonary Rehab from 03/24/2022 in [facility] Cardiac and Pulmonary Rehab  Education need identified 12/16/21  Date 12/16/21  Educator KL  Instruction Review Code 1- Verbalizes Understanding       Falls Prevention: - Provides verbal and written material to individual with discussion  of falls prevention and safety. Flowsheet Row Pulmonary Rehab from 12/03/2021 in [facility] Card[facility] and Pulmonary Rehab  Education need identified 12/03/21  Date 12/03/21  Educator MC  Instruction Review Code 1- Verbalizes Understanding       Chronic Lung Disease Review: - Group verbal instruction with posters, models, PowerPoint presentations and videos,  to review new updates, new respiratory  medications, new advancements in procedures and treatments. Providing information on websites and "800" numbers for continued self-education. Includes information about supplement oxygen, available portable oxygen systems, continuous and intermittent flow rates, oxygen safety, concentrators, and Medicare reimbursement for oxygen. Explanation of Pulmonary Drugs, including class, frequency, complications, importance of spacers, rinsing mouth after steroid MDI's, and proper cleaning methods for nebulizers. Review of basic lung anatomy and physiology related to function, structure, and complications of lung disease. Review of risk factors. Discussion about methods for diagnosing sleep apnea and types of masks and machines for OSA. Includes a review of the use of types of environmental controls: home humidity, furnaces, filters, dust mite/pet prevention, HEPA vacuums. Discussion about weather changes, air quality and the benefits of nasal washing. Instruction on Warning signs, infection symptoms, calling MD promptly, preventive modes, and value of vaccinations. Review of effective airway clearance, coughing and/or vibration techniques. Emphasizing that all should Create an Action Plan. Written material given at graduation. Flowsheet Row Pulmonary Rehab from 03/24/2022 in [facility] Cardiac and Pulmonary Rehab  Education need identified 12/16/21  Date 12/30/21  Educator [facility]  Instruction Review Code 1- Verbalizes Understanding       AED/CPR: - Group verbal and written instruction with the use of models to demonstrate the basic use of the AED with the basic ABC's of resuscitation.    Anatomy and Cardiac Procedures: - Group verbal and visual presentation and models provide information about basic cardiac anatomy and function. Reviews the testing methods done to diagnose heart disease and the outcomes of the test results. Describes the treatment choices: Medical Management, Angioplasty, or Coronary Bypass Surgery for  treating various heart conditions including Myocardial Infarction, Angina, Valve Disease, and Cardiac Arrhythmias.  Written material given at graduation.   Medication Safety: - Group verbal and visual instruction to review commonly prescribed medications for heart and lung disease. Reviews the medication, class of the drug, and side effects. Includes the steps to properly store meds and maintain the prescription regimen.  Written material given at graduation.   Other: -Provides group and verbal instruction on various topics (see comments)   Knowledge Questionnaire Score:  Knowledge Questionnaire Score - 12/16/21 1706       Knowledge Questionnaire Score   Pre Score 13/18              Core Components/Risk Factors/Patient Goals at Admission:  Personal Goals and Risk Factors at Admission - 12/16/21 1722       Core Components/Risk Factors/Patient Goals on Admission    Weight Management Yes;Weight Loss    Intervention Weight Management: Develop a combined nutrition and exercise program designed to reach desired caloric intake, while maintaining appropriate intake of nutrient and fiber, sodium and fats, and appropriate energy expenditure required for the weight goal.;Weight Management: Provide education and appropriate resources to help participant work on and attain dietary goals.;Weight Management/Obesity: Establish reasonable short term and long term weight goals.    Admit Weight 185 lb (83.9 kg)    Goal Weight: Short Term 180 lb (81.6 kg)    Goal Weight: Long Term 175 lb (79.4 kg)    Expected Outcomes Short Term:  Continue to assess and modify interventions until short term weight is achieved;Long Term: Adherence to nutrition and physical activity/exercise program aimed toward attainment of established weight goal;Understanding recommendations for meals to include 15-35% energy as protein, 25-35% energy from fat, 35-60% energy from carbohydrates, less than 200mg  of dietary cholesterol,  20-35 gm of total fiber daily;Understanding of distribution of calorie intake throughout the day with the consumption of 4-5 meals/snacks;Weight Loss: Understanding of general recommendations for a balanced deficit meal plan, which promotes 1-2 lb weight loss per week and includes a negative energy balance of [phone] kcal/d    Improve shortness of breath with ADL's Yes    Intervention Provide education, individualized exercise plan and daily activity instruction to help decrease symptoms of SOB with activities of daily living.    Expected Outcomes Short Term: Improve cardiorespiratory fitness to achieve a reduction of symptoms when performing ADLs;Long Term: Be able to perform more ADLs without symptoms or delay the onset of symptoms    Increase knowledge of respiratory medications and ability to use respiratory devices properly  Yes    Intervention Provide education and demonstration as needed of appropriate use of medications, inhalers, and oxygen therapy.    Expected Outcomes Short Term: Achieves understanding of medications use. Understands that oxygen is a medication prescribed by physician. Demonstrates appropriate use of inhaler and oxygen therapy.;Long Term: Maintain appropriate use of medications, inhalers, and oxygen therapy.    Hypertension Yes    Intervention Provide education on lifestyle modifcations including regular physical activity/exercise, weight management, moderate sodium restriction and increased consumption of fresh fruit, vegetables, and low fat dairy, alcohol moderation, and smoking cessation.;Monitor prescription use compliance.    Expected Outcomes Short Term: Continued assessment and intervention until BP is < 140/82mm HG in hypertensive participants. < 130/21mm HG in hypertensive participants with diabetes, heart failure or chronic kidney disease.;Long Term: Maintenance of blood pressure at goal levels.    Lipids Yes    Intervention Provide education and support for  participant on nutrition & aerobic/resistive exercise along with prescribed medications to achieve LDL 70mg , HDL >40mg .    Expected Outcomes Short Term: Participant states understanding of desired cholesterol values and is compliant with medications prescribed. Participant is following exercise prescription and nutrition guidelines.;Long Term: Cholesterol controlled with medications as prescribed, with individualized exercise RX and with personalized nutrition plan. Value goals: LDL < 70mg , HDL > 40 mg.             Education:Diabetes - Individual verbal and written instruction to review signs/symptoms of diabetes, desired ranges of glucose level fasting, after meals and with exercise. Acknowledge that pre and post exercise glucose checks will be done for 3 sessions at entry of program.   Know Your Numbers and Heart Failure: - Group verbal and visual instruction to discuss disease risk factors for cardiac and pulmonary disease and treatment options.  Reviews associated critical values for Overweight/Obesity, Hypertension, Cholesterol, and Diabetes.  Discusses basics of heart failure: signs/symptoms and treatments.  Introduces Heart Failure Zone chart for action plan for heart failure.  Written material given at graduation. Flowsheet Row Pulmonary Rehab from 03/24/2022 in [facility] Cardiac and Pulmonary Rehab  Date 12/23/21  Educator SB  Instruction Review Code 1- Verbalizes Understanding       Core Components/Risk Factors/Patient Goals Review:   Goals and Risk Factor Review     Row Name 12/30/21 1346 02/15/22 1419 03/08/22 1432 04/05/22 1345 04/26/22 1446     Core Components/Risk Factors/Patient Goals Review   Personal Goals  Review Weight Management/Obesity;Hypertension;Improve shortness of breath with ADL's;Lipids Weight Management/Obesity;Hypertension;Improve shortness of breath with ADL's;Lipids Weight Management/Obesity;Hypertension;Improve shortness of breath with ADL's;Lipids Weight  Management/Obesity;Hypertension;Improve shortness of breath with ADL's;Lipids;Increase knowledge of respiratory medications and ability to use respiratory devices properly. Weight Management/Obesity;Hypertension;Improve shortness of breath with ADL's;Increase knowledge of respiratory medications and ability to use respiratory devices properly.   Review Sean Waters feels he is doing well in rehab so far. He continues to take his medications as directed without issues. He doesn't check his BP at home often, but he has a BP cuff; encouraged him to check BP at home at least 1-2x/week. Sean Waters reports his shortness of breath has been doing well, but the heat can make it difficult. Sean RungJoe is doing well.  He is doing well with his breathing and good about using his inhalers.  We reviewed what the purpose for each one was.  His pressures are doing well and he continues to monitor it at home.  He did have a high reading at the doctor's office, but it's been good at other place. He got good results with his 6MWT in the office. He decreased his prednisone to 10 mg.  Sean RungJoe is doing well with his weight as well. Sean Waters's weight has been maintained. He feels that he is improving with his SOB and while he is weighting to meet with a [facility] [place] he is at least feeling better then he was. He is hoping to get more answers once he meets with these new doctors regarding the status of his lung disease. He continues to take all meds a prescribed. Sean RungJoe is doing well in rehab. He is getting better with he breathing. He has not needed his inhaler but is compliant with his CPAP.  His pressures are doing well here and at home.  His weight is staying steady.  He would like to lose and continues to work on it. Sean Waters feels good overall since starting the program. He is diligent about watching his oxygen saturations and HR when at home. He doesn't get too SOB when doing high strenuous acitivies. His breathing is labored when he  exercises but knows that just comes with his increase in workload. He is staying compliant with his medications. His weight at rehab has been stable ranging on average 187- 190 lb and knows to notify doctor if anything becomes abnormal. He doesn't normally check his BP at home as often as he should, but pressures at rehab have been good. Encouraged for him to start checking them again, especially now that is getting ready to graduate. He is overall pleased knowing his medications are working and was even able to decrease his prednisone dosage. He had questions about how quick his lung disease can progress, and advised him to ask his doctor as the staff here is not able to determine that. Overall, his numbers have been looking good on our end, patient has been feeling well- but also explained to patient that we can't determine a prognosis or how quick the disease is advancing. Patient is due to see the [facility] team the 1st week of January. He will be graduating soon after that.   Expected Outcomes ST: check BP at home 1-2x/week  LT: monitor risk factors Short: conitnue to montior bp Long: Continue to montior risk facros. Short: [facility] to get more answers about status of lung disease. Long: continue to monitor risk factors. Short: Conitnue to work on [employer]  loss Long: conitnue to monitor risk factors. Short: Graduate LonG: Continue to monitor lifestyle risk factors    Row Name 05/25/22 1117             Core Components/Risk Factors/Patient Goals Review   Personal Goals Review Weight Management/Obesity;Hypertension;Improve shortness of breath with ADL's;Increase knowledge of respiratory medications and ability to use respiratory devices properly.       Review Sean Waters states that he has gained a little weight recently, but would like to lose some weight with a goal weight of around 170 lbs. He has not been checking his BP at home, however, he does have a cuff at home. He  was encouraged to start checking his BP at home after graduating from the program. He reports that he has continued to take all his medications as prescribed as well. He was encouraged to continue to monitor his lifestyle risk factors after graduating.       Expected Outcomes Short: Graduate Long: Continue to monitor lifestyle risk factors                Core Components/Risk Factors/Patient Goals at Discharge (Final Review):   Goals and Risk Factor Review - 05/25/22 1117       Core Components/Risk Factors/Patient Goals Review   Personal Goals Review Weight Management/Obesity;Hypertension;Improve shortness of breath with ADL's;Increase knowledge of respiratory medications and ability to use respiratory devices properly.    Review Sean Waters states that he has gained a little weight recently, but would like to lose some weight with a goal weight of around 170 lbs. He has not been checking his BP at home, however, he does have a cuff at home. He was encouraged to start checking his BP at home after graduating from the program. He reports that he has continued to take all his medications as prescribed as well. He was encouraged to continue to monitor his lifestyle risk factors after graduating.    Expected Outcomes Short: Graduate Long: Continue to monitor lifestyle risk factors             ITP Comments:  ITP Comments     Row Name 12/03/21 1430 12/16/21 1705 12/23/21 0955 01/20/22 1344 02/17/22 0754   ITP Comments Virtual orientation call completed today. he has an appointment on Date: 12/16/21  for EP eval and gym Orientation.  Documentation of diagnosis can be found in [facility] Date: 10/28/21 . Completed and gym orientation. Initial ITP created and sent for review to Dr. Vida Rigger, Medical Director. 30 Day review completed. Medical Director ITP review done, changes made as directed, and signed approval by Medical Director.    NEW 30 Day review completed. Medical Director ITP review done, changes  made as directed, and signed approval by Medical Director. 30 Day review completed. Medical Director ITP review done, changes made as directed, and signed approval by Medical Director.    Row Name 03/17/22 1026 04/14/22 0944 05/12/22 1116 06/01/22 1108     ITP Comments 30 Day review completed. Medical Director ITP review done, changes made as directed, and signed approval by Medical Director. 30 Day review completed. Medical Director ITP review done, changes made as directed, and signed approval by Medical Director. 30 Day review completed. Medical Director ITP review done, changes made as directed, and signed approval by Medical Director. Ashten graduated today from  rehab with 36 sessions completed.  Details of the patient's exercise prescription and what He needs to do in order to continue the prescription and progress were discussed  with patient.  Patient was given a copy of prescription and goals.  Patient verbalized understanding.  Kristoph plans to continue to exercise by using his home stepper, recumbent bike, and walking.             Comments: Discharge ITP

## 2024-03-23 ENCOUNTER — Inpatient Hospital Stay: Attending: Oncology | Admitting: Oncology

## 2024-03-23 ENCOUNTER — Encounter: Payer: Self-pay | Admitting: Oncology

## 2024-03-23 ENCOUNTER — Inpatient Hospital Stay

## 2024-03-23 VITALS — BP 158/80 | HR 62 | Temp 97.5°F | Resp 16 | Wt 179.0 lb

## 2024-03-23 DIAGNOSIS — Z79899 Other long term (current) drug therapy: Secondary | ICD-10-CM | POA: Insufficient documentation

## 2024-03-23 DIAGNOSIS — J8410 Pulmonary fibrosis, unspecified: Secondary | ICD-10-CM | POA: Insufficient documentation

## 2024-03-23 DIAGNOSIS — Z87891 Personal history of nicotine dependence: Secondary | ICD-10-CM | POA: Diagnosis not present

## 2024-03-23 DIAGNOSIS — D649 Anemia, unspecified: Secondary | ICD-10-CM | POA: Diagnosis present

## 2024-03-23 LAB — CBC (CANCER CENTER ONLY)
HCT: 40.9 % (ref 39.0–52.0)
Hemoglobin: 13.4 g/dL (ref 13.0–17.0)
MCH: 29.1 pg (ref 26.0–34.0)
MCHC: 32.8 g/dL (ref 30.0–36.0)
MCV: 88.9 fL (ref 80.0–100.0)
Platelet Count: 328 K/uL (ref 150–400)
RBC: 4.6 MIL/uL (ref 4.22–5.81)
RDW: 12.8 % (ref 11.5–15.5)
WBC Count: 7.1 K/uL (ref 4.0–10.5)
nRBC: 0 % (ref 0.0–0.2)

## 2024-03-23 LAB — IRON AND TIBC
Iron: 68 ug/dL (ref 45–182)
Saturation Ratios: 19 % (ref 17.9–39.5)
TIBC: 365 ug/dL (ref 250–450)
UIBC: 297 ug/dL

## 2024-03-23 LAB — FOLATE: Folate: 13.4 ng/mL (ref >5.9)

## 2024-03-23 LAB — VITAMIN B12: Vitamin B-12: 182 pg/mL (ref 180–914)

## 2024-03-23 LAB — LACTATE DEHYDROGENASE: LDH: 200 U/L — ABNORMAL HIGH (ref 98–192)

## 2024-03-23 LAB — FERRITIN: Ferritin: 208 ng/mL (ref 24–336)

## 2024-03-23 NOTE — Progress Notes (Signed)
 [facility]  [phone] [phone]  ID: Herron Fero OB: 31-Mar-1941  MR#: [id number]  RDW#:[id number]  Patient Care Team: Rudolpho Norleen BIRCH, MD as PCP - General (Internal Medicine)  CHIEF COMPLAINT: Anemia, unspecified.  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  He is referred for further evaluation.  He currently feels well and is at his baseline.  He has no neurologic complaints.  He denies any recent fevers or illnesses.  He has a good appetite and denies weight loss.  He has no chest pain, shortness of breath, cough, or hemoptysis.  He denies any nausea, vomiting, constipation, or diarrhea.  He has no urinary complaints.  Patient feels at his baseline and offers no specific complaints today.  REVIEW OF SYSTEMS:   Review of Systems  Constitutional: Negative.  Negative for fever, malaise/fatigue and weight loss.  Respiratory: Negative.  Negative for cough, hemoptysis and shortness of breath.   Cardiovascular: Negative.  Negative for chest pain and leg swelling.  Gastrointestinal: Negative.  Negative for abdominal pain, blood in stool and melena.  Genitourinary: Negative.  Negative for dysuria.  Musculoskeletal: Negative.  Negative for back pain.  Skin: Negative.  Negative for rash.  Neurological: Negative.  Negative for dizziness, focal weakness, weakness and headaches.  Psychiatric/Behavioral: Negative.  The patient is not nervous/anxious.     As per HPI. Otherwise, a complete review of systems is negative.  PAST MEDICAL HISTORY: Past Medical History:  Diagnosis Date   Arthritis of left hand    Degeneration of cervical intervertebral disc    Osteoarthrosis of left hand     PAST SURGICAL HISTORY: History reviewed. No pertinent surgical history.  FAMILY HISTORY: History reviewed. No pertinent family history.  ADVANCED DIRECTIVES  (Y/N):  N  HEALTH MAINTENANCE: Social History   Tobacco Use   Smoking status: Former    Current packs/day: 0.00    Types: Cigarettes    Quit date: 03/17/1953    Years since quitting: 71.0   Smokeless tobacco: Never  Vaping Use   Vaping status: Never Used     Colonoscopy:  PAP:  Bone density:  Lipid panel:  Not on File  Current Outpatient Medications  Medication Sig Dispense Refill   albuterol (VENTOLIN HFA) 108 (90 Base) MCG/ACT inhaler Inhale 2 puffs into the lungs every 6 (six) hours as needed.     ALPRAZolam (XANAX) 0.25 MG tablet Take 0.25 mg by mouth as needed for anxiety.     diclofenac Sodium (VOLTAREN) 1 % GEL APPLY 2 GRAMS TO THE AFFECTED AREA(S) BY TOPICAL ROUTE 4 TIMES PER DAY     enalapril (VASOTEC) 10 MG tablet Take 1.5 tablets by mouth daily.     ezetimibe-simvastatin (VYTORIN) 10-10 MG tablet Take 1 tablet by mouth at bedtime.     levothyroxine (SYNTHROID) 50 MCG tablet Take 50 mcg by mouth daily.     mycophenolate (CELLCEPT) 500 MG tablet Take 3 tablets twice a day by oral route.     tadalafil (CIALIS) 5 MG tablet Take by mouth.     predniSONE (DELTASONE) 10 MG tablet Take 1 tablet by mouth daily. (Patient not taking: Reported on 03/23/2024)     predniSONE (DELTASONE) 5 MG tablet Take by mouth. (Patient not taking: Reported on 03/23/2024)     No current facility-administered medications for this visit.    OBJECTIVE: Vitals:   03/23/24 0918 03/23/24 0922  BP: (!) 169/66 (!) 158/80  Pulse: 62   Resp: 16   Temp: (!) 97.5 F (36.4 C)   SpO2: 98%      Body mass index is 30.73 kg/m.    ECOG FS:0 - Asymptomatic  General: Well-developed, well-nourished, no acute distress. Eyes: Pink conjunctiva, anicteric sclera. HEENT: Normocephalic, moist mucous membranes. Lungs: No audible wheezing or coughing. Heart: Regular rate and rhythm. Abdomen: Soft, nontender, no obvious distention. Musculoskeletal: No edema, cyanosis, or clubbing. Neuro: Alert, answering all  questions appropriately. Cranial nerves grossly intact. Skin: No rashes or petechiae noted. Psych: Normal affect. Lymphatics: No cervical, calvicular, axillary or inguinal LAD.   LAB RESULTS:  Lab Results  Component Value Date   CREATININE 1.20 07/24/2021    Lab Results  Component Value Date   WBC 7.1 03/23/2024   HGB 13.4 03/23/2024   HCT 40.9 03/23/2024   MCV 88.9 03/23/2024   PLT 328 03/23/2024   Lab Results  Component Value Date   IRON 68 03/23/2024   TIBC 365 03/23/2024   IRONPCTSAT 19 03/23/2024   Lab Results  Component Value Date   FERRITIN 208 03/23/2024     STUDIES: No results found.  ASSESSMENT: Anemia, unspecified.  PLAN:    Anemia, unspecified: Patient's hemoglobin is within normal limits at 13.4.  Iron stores and folate are within normal limits.  He has a mildly elevated LDH, but do not suspect hemolysis.  The remainder of his laboratory work including B12, SPEP, and myeloid NGS panel are pending at time of dictation.  No intervention is needed at this time.  Patient does not require bone marrow biopsy.  Return to clinic in 1 month for further evaluation and discussion of his results. Pulmonary fibrosis: Continue mycophenolate and follow-up with pulmonary as indicated.  I spent a total of 45 minutes reviewing chart data, face-to-face evaluation with the patient, counseling and coordination of care as detailed above.   Patient expressed understanding and was in agreement with this plan. He also understands that He can call clinic at any time with any questions, concerns, or complaints.    Evalene JINNY Reusing, MD   03/23/2024 11:43 AM

## 2024-03-23 NOTE — Progress Notes (Signed)
 Patient hasn't had a good appetite for the past couple of months. He is having a little bit of shortness of breath with excretion.

## 2024-03-25 LAB — HAPTOGLOBIN: Haptoglobin: 174 mg/dL (ref 38–329)

## 2024-03-26 LAB — PROTEIN ELECTROPHORESIS, SERUM
A/G Ratio: 1.2 (ref 0.7–1.7)
Albumin ELP: 3.3 g/dL (ref 2.9–4.4)
Alpha-1-Globulin: 0.3 g/dL (ref 0.0–0.4)
Alpha-2-Globulin: 0.8 g/dL (ref 0.4–1.0)
Beta Globulin: 1.1 g/dL (ref 0.7–1.3)
Gamma Globulin: 0.6 g/dL (ref 0.4–1.8)
Globulin, Total: 2.8 g/dL (ref 2.2–3.9)
Total Protein ELP: 6.1 g/dL (ref 6.0–8.5)

## 2024-04-03 LAB — MYELOID NGS

## 2024-04-20 ENCOUNTER — Inpatient Hospital Stay: Admitting: Oncology

## 2024-07-24 ENCOUNTER — Inpatient Hospital Stay

## 2024-07-24 ENCOUNTER — Inpatient Hospital Stay: Admitting: Oncology
